# Patient Record
Sex: Female | Born: 1990 | Hispanic: No | Marital: Single | Smoking: Never smoker
Health system: Southern US, Community
[De-identification: ages and names within clinical notes are randomized; demographics above are authoritative.]

## PROBLEM LIST (undated history)

## (undated) DIAGNOSIS — J45909 Unspecified asthma, uncomplicated: Secondary | ICD-10-CM

## (undated) HISTORY — PX: NO PAST SURGERIES: SHX2092

---

## 2017-03-29 ENCOUNTER — Emergency Department (HOSPITAL_BASED_OUTPATIENT_CLINIC_OR_DEPARTMENT_OTHER)
Admission: EM | Admit: 2017-03-29 | Discharge: 2017-03-29 | Disposition: A | Discharge status: Home / Self Care | Payer: No Typology Code available for payment source | Attending: Emergency Medicine | Admitting: Emergency Medicine

## 2017-03-29 ENCOUNTER — Encounter (HOSPITAL_BASED_OUTPATIENT_CLINIC_OR_DEPARTMENT_OTHER): Payer: Self-pay | Admitting: *Deleted

## 2017-03-29 DIAGNOSIS — Z3A01 Less than 8 weeks gestation of pregnancy: Secondary | ICD-10-CM | POA: Diagnosis not present

## 2017-03-29 DIAGNOSIS — F17290 Nicotine dependence, other tobacco product, uncomplicated: Secondary | ICD-10-CM | POA: Insufficient documentation

## 2017-03-29 DIAGNOSIS — O26891 Other specified pregnancy related conditions, first trimester: Secondary | ICD-10-CM | POA: Diagnosis not present

## 2017-03-29 DIAGNOSIS — O219 Vomiting of pregnancy, unspecified: Secondary | ICD-10-CM | POA: Insufficient documentation

## 2017-03-29 DIAGNOSIS — R1013 Epigastric pain: Secondary | ICD-10-CM | POA: Insufficient documentation

## 2017-03-29 DIAGNOSIS — J45909 Unspecified asthma, uncomplicated: Secondary | ICD-10-CM | POA: Insufficient documentation

## 2017-03-29 DIAGNOSIS — Z3490 Encounter for supervision of normal pregnancy, unspecified, unspecified trimester: Secondary | ICD-10-CM

## 2017-03-29 DIAGNOSIS — R112 Nausea with vomiting, unspecified: Secondary | ICD-10-CM

## 2017-03-29 DIAGNOSIS — O99331 Smoking (tobacco) complicating pregnancy, first trimester: Secondary | ICD-10-CM | POA: Insufficient documentation

## 2017-03-29 HISTORY — DX: Unspecified asthma, uncomplicated: J45.909

## 2017-03-29 LAB — URINALYSIS, ROUTINE W REFLEX MICROSCOPIC
BILIRUBIN URINE: NEGATIVE
Glucose, UA: NEGATIVE mg/dL
Hgb urine dipstick: NEGATIVE
Ketones, ur: NEGATIVE mg/dL
NITRITE: NEGATIVE
PROTEIN: NEGATIVE mg/dL
Specific Gravity, Urine: 1.017 (ref 1.005–1.030)
pH: 6.5 (ref 5.0–8.0)

## 2017-03-29 LAB — URINALYSIS, MICROSCOPIC (REFLEX): RBC / HPF: NONE SEEN RBC/hpf (ref 0–5)

## 2017-03-29 LAB — PREGNANCY, URINE: PREG TEST UR: POSITIVE — AB

## 2017-03-29 MED ORDER — PROMETHAZINE HCL 25 MG PO TABS: 25.0000 mg | ORAL_TABLET | Freq: Four times a day (QID) | ORAL | 1 refills | Status: DC | PRN

## 2017-03-29 MED ORDER — ONDANSETRON 4 MG PO TBDP
4.0000 mg | ORAL_TABLET | Freq: Once | ORAL | Status: AC
  Administered 2017-03-29: 4 mg via ORAL
  Filled 2017-03-29: qty 1

## 2017-03-29 MED ORDER — PRENATAL VITAMINS 28-0.8 MG PO TABS: 1.0000 | ORAL_TABLET | Freq: Every day | ORAL | 3 refills | Status: DC

## 2017-03-29 NOTE — ED Triage Notes (Signed)
Pt reports abd pain, n/v x1wk. Denies fever, diarrhea, genitourinary symptoms.

## 2017-03-29 NOTE — ED Notes (Signed)
Pt ambulating, in NAD. Given vomit bag.

## 2017-03-29 NOTE — ED Notes (Signed)
MD notified of pt's positive pregnancy test. Okay per MD to hold off on drawing bloodwork until he's assessed pt.

## 2017-03-29 NOTE — Discharge Instructions (Signed)
Pregnancy test positive. Start taking prenatal vitamins. Follow-up with OB/GYN in the [place] area. Take the Phenergan as needed for nausea and vomiting. Return for severe lower abdominal pain or vaginal bleeding.

## 2017-03-29 NOTE — ED Provider Notes (Signed)
[facility] Provider Note   CSN: [id number] Arrival date & time: 03/29/17  0715     History   Chief Complaint Chief Complaint  Patient presents with  . Abdominal Pain    HPI Monica Glover is a 26 y.o. female.  Patient with one-week history of nausea and vomiting 2-3 times a day. Able to keep down crackers and fruit. No diarrhea no fevers. Some epigastric abdominal discomfort. No lower abdominal discomfort. No vaginal bleeding. No dysuria. Last menstrual period unknown periods are regular.      Past Medical History:  Diagnosis Date  . Asthma     There are no active problems to display for this patient.   History reviewed. No pertinent surgical history.  OB History    No data available       Home Medications    Prior to Admission medications   Medication Sig Start Date End Date Taking? Authorizing Provider  Prenatal Vit-Fe Fumarate-FA (PRENATAL VITAMINS) 28-0.8 MG TABS Take 1 tablet by mouth daily. 03/29/17   Vanetta Mulders, MD  promethazine (PHENERGAN) 25 MG tablet Take 1 tablet (25 mg total) by mouth every 6 (six) hours as needed for nausea or vomiting. 03/29/17   Vanetta Mulders, MD    Family History No family history on file.  Social History Social History  Substance Use Topics  . Smoking status: Current Some Day Smoker    Types: Cigars  . Smokeless tobacco: Never Used  . Alcohol use No     Allergies   Patient has no known allergies.   Review of Systems Review of Systems  Constitutional: Negative for fever.  HENT: Negative for congestion.   Respiratory: Negative for shortness of breath.   Cardiovascular: Negative for chest pain.  Gastrointestinal: Positive for abdominal pain, nausea and vomiting. Negative for diarrhea.  Genitourinary: Negative for dysuria and vaginal bleeding.  Musculoskeletal: Negative for back pain.  Skin: Negative for rash.  Neurological: Negative for headaches.  Hematological: Does not bruise/bleed  easily.  Psychiatric/Behavioral: Negative for confusion.     Physical Exam Updated Vital Signs BP (!) 123/58 (BP Location: Left Arm)   Pulse 76   Temp 97.8 F (36.6 C) (Oral)   Resp 18   Ht  (1.676 m)   Wt 97.1 kg   LMP 03/10/2017 (Approximate)   SpO2 100%   BMI 34.54 kg/m   Physical Exam  Constitutional: She is oriented to person, place, and time. She appears well-developed and well-nourished. No distress.  HENT:  Head: Normocephalic and atraumatic.  Mouth/Throat: Oropharynx is clear and moist.  Eyes: Conjunctivae and EOM are normal. Pupils are equal, round, and reactive to light.  Neck: Normal range of motion. Neck supple.  Cardiovascular: Normal rate, regular rhythm and normal heart sounds.   Pulmonary/Chest: Effort normal and breath sounds normal. No respiratory distress.  Abdominal: Soft. Bowel sounds are normal. There is no tenderness.  Musculoskeletal: Normal range of motion.  Neurological: She is alert and oriented to person, place, and time. No cranial nerve deficit or sensory deficit. She exhibits normal muscle tone. Coordination normal.  Skin: Skin is warm.  Nursing note and vitals reviewed.    ED Treatments / Results  Labs (all labs ordered are listed, but only abnormal results are displayed) Labs Reviewed  URINALYSIS, ROUTINE W REFLEX MICROSCOPIC - Abnormal; Notable for the following:       Result Value   APPearance TURBID (*)    Leukocytes, UA SMALL (*)    All other components  within normal limits  PREGNANCY, URINE - Abnormal; Notable for the following:    Preg Test, Ur POSITIVE (*)    All other components within normal limits  URINALYSIS, MICROSCOPIC (REFLEX) - Abnormal; Notable for the following:    Bacteria, UA MANY (*)    Squamous Epithelial / LPF 6-30 (*)    All other components within normal limits    EKG  EKG Interpretation None       Radiology No results found.  Procedures Procedures (including critical care  time)  Medications Ordered in ED Medications  ondansetron (ZOFRAN-ODT) disintegrating tablet 4 mg (not administered)     Initial Impression / Assessment and Plan / ED Course  I have reviewed the triage vital signs and the nursing notes.  Pertinent labs & imaging results that were available during my care of the patient were reviewed by me and considered in my medical decision making (see chart for details).    Patient gravida 1 para 0. Last measured. Unknown since periods have been irregular. Positive pregnancy test today probably explains the nausea and vomiting. Patient nontoxic no acute distress. Will treat with Phenergan and prenatal vitamins patients from the [facility] area social follow-up with OB/GYN there. No vaginal bleeding no lower abdominal pain. No concerns for ectopic pregnancy at this time.   Final Clinical Impressions(s) / ED Diagnoses   Final diagnoses:  Pregnancy, unspecified gestational age  Non-intractable vomiting with nausea, unspecified vomiting type    New Prescriptions New Prescriptions   PRENATAL VIT-FE FUMARATE-FA (PRENATAL VITAMINS) 28-0.8 MG TABS    Take 1 tablet by mouth daily.   PROMETHAZINE (PHENERGAN) 25 MG TABLET    Take 1 tablet (25 mg total) by mouth every 6 (six) hours as needed for nausea or vomiting.     Vanetta Mulders, MD 03/29/17 [id number]

## 2017-11-24 ENCOUNTER — Other Ambulatory Visit: Payer: Self-pay

## 2017-11-24 ENCOUNTER — Inpatient Hospital Stay (HOSPITAL_COMMUNITY)
Admission: AD | Admit: 2017-11-24 | Discharge: 2017-11-26 | DRG: 787 | Disposition: A | Discharge status: Home / Self Care | Payer: Medicaid Other | Source: Ambulatory Visit | Attending: Obstetrics and Gynecology | Admitting: Obstetrics and Gynecology

## 2017-11-24 ENCOUNTER — Inpatient Hospital Stay (HOSPITAL_COMMUNITY): Payer: Medicaid Other | Admitting: Anesthesiology

## 2017-11-24 ENCOUNTER — Encounter (HOSPITAL_COMMUNITY): Payer: Self-pay | Admitting: *Deleted

## 2017-11-24 ENCOUNTER — Encounter (HOSPITAL_COMMUNITY)
Admission: AD | Disposition: A | Discharge status: Home / Self Care | Payer: Self-pay | Source: Ambulatory Visit | Attending: Obstetrics and Gynecology

## 2017-11-24 DIAGNOSIS — E669 Obesity, unspecified: Secondary | ICD-10-CM | POA: Diagnosis present

## 2017-11-24 DIAGNOSIS — Z3A40 40 weeks gestation of pregnancy: Secondary | ICD-10-CM

## 2017-11-24 DIAGNOSIS — O9081 Anemia of the puerperium: Secondary | ICD-10-CM | POA: Diagnosis not present

## 2017-11-24 DIAGNOSIS — D62 Acute posthemorrhagic anemia: Secondary | ICD-10-CM | POA: Diagnosis not present

## 2017-11-24 DIAGNOSIS — J45909 Unspecified asthma, uncomplicated: Secondary | ICD-10-CM | POA: Diagnosis present

## 2017-11-24 DIAGNOSIS — O99214 Obesity complicating childbirth: Principal | ICD-10-CM | POA: Diagnosis present

## 2017-11-24 DIAGNOSIS — O9952 Diseases of the respiratory system complicating childbirth: Secondary | ICD-10-CM | POA: Diagnosis present

## 2017-11-24 DIAGNOSIS — O43123 Velamentous insertion of umbilical cord, third trimester: Secondary | ICD-10-CM | POA: Diagnosis present

## 2017-11-24 DIAGNOSIS — Z3483 Encounter for supervision of other normal pregnancy, third trimester: Secondary | ICD-10-CM | POA: Diagnosis present

## 2017-11-24 LAB — CBC
HCT: 35.6 % — ABNORMAL LOW (ref 36.0–46.0)
HEMOGLOBIN: 11.3 g/dL — AB (ref 12.0–15.0)
MCH: 28.8 pg (ref 26.0–34.0)
MCHC: 31.7 g/dL (ref 30.0–36.0)
MCV: 90.6 fL (ref 78.0–100.0)
Platelets: 218 10*3/uL (ref 150–400)
RBC: 3.93 MIL/uL (ref 3.87–5.11)
RDW: 15.2 % (ref 11.5–15.5)
WBC: 10.4 10*3/uL (ref 4.0–10.5)

## 2017-11-24 LAB — TYPE AND SCREEN
ABO/RH(D): O POS
ANTIBODY SCREEN: NEGATIVE

## 2017-11-24 LAB — RPR: RPR: NONREACTIVE

## 2017-11-24 LAB — ABO/RH: ABO/RH(D): O POS

## 2017-11-24 SURGERY — Surgical Case
Anesthesia: Epidural

## 2017-11-24 MED ORDER — TETANUS-DIPHTH-ACELL PERTUSSIS 5-2.5-18.5 LF-MCG/0.5 IM SUSP: 0.5000 mL | Freq: Once | INTRAMUSCULAR | Status: DC

## 2017-11-24 MED ORDER — SODIUM CHLORIDE 0.9% FLUSH: 3.0000 mL | INTRAVENOUS | Status: DC | PRN

## 2017-11-24 MED ORDER — OXYCODONE-ACETAMINOPHEN 5-325 MG PO TABS: 1.0000 | ORAL_TABLET | ORAL | Status: DC | PRN

## 2017-11-24 MED ORDER — OXYTOCIN 40 UNITS IN LACTATED RINGERS INFUSION - SIMPLE MED
2.5000 [IU]/h | INTRAVENOUS | Status: DC
  Filled 2017-11-24: qty 1000

## 2017-11-24 MED ORDER — OXYCODONE HCL 5 MG PO TABS: 5.0000 mg | ORAL_TABLET | Freq: Once | ORAL | Status: DC | PRN

## 2017-11-24 MED ORDER — LIDOCAINE-EPINEPHRINE 2 %-1:100000 IJ SOLN
INTRAMUSCULAR | Status: DC | PRN
  Administered 2017-11-24: 3 mL via INTRADERMAL
  Administered 2017-11-24: 7 mL via INTRADERMAL
  Administered 2017-11-24: 5 mL via INTRADERMAL

## 2017-11-24 MED ORDER — SENNOSIDES-DOCUSATE SODIUM 8.6-50 MG PO TABS
2.0000 | ORAL_TABLET | ORAL | Status: DC
  Administered 2017-11-25 (×2): 2 via ORAL
  Filled 2017-11-24 (×2): qty 2

## 2017-11-24 MED ORDER — SODIUM BICARBONATE 8.4 % IV SOLN
INTRAVENOUS | Status: AC
  Filled 2017-11-24: qty 50

## 2017-11-24 MED ORDER — OXYCODONE-ACETAMINOPHEN 5-325 MG PO TABS
2.0000 | ORAL_TABLET | ORAL | Status: DC | PRN
  Administered 2017-11-26: 2 via ORAL
  Filled 2017-11-24: qty 2

## 2017-11-24 MED ORDER — OXYTOCIN 40 UNITS IN LACTATED RINGERS INFUSION - SIMPLE MED
1.0000 m[IU]/min | INTRAVENOUS | Status: DC
  Administered 2017-11-24: 2 m[IU]/min via INTRAVENOUS

## 2017-11-24 MED ORDER — MORPHINE SULFATE (PF) 0.5 MG/ML IJ SOLN
INTRAMUSCULAR | Status: AC
  Filled 2017-11-24: qty 10

## 2017-11-24 MED ORDER — METHYLERGONOVINE MALEATE 0.2 MG/ML IJ SOLN: 0.2000 mg | INTRAMUSCULAR | Status: DC | PRN

## 2017-11-24 MED ORDER — DIPHENHYDRAMINE HCL 25 MG PO CAPS: 25.0000 mg | ORAL_CAPSULE | ORAL | Status: DC | PRN

## 2017-11-24 MED ORDER — MORPHINE SULFATE (PF) 0.5 MG/ML IJ SOLN
INTRAMUSCULAR | Status: DC | PRN
  Administered 2017-11-24: 4 mg via EPIDURAL
  Administered 2017-11-24: 1 mg via INTRAVENOUS

## 2017-11-24 MED ORDER — COCONUT OIL OIL: 1.0000 "application " | TOPICAL_OIL | Status: DC | PRN

## 2017-11-24 MED ORDER — ONDANSETRON HCL 4 MG/2ML IJ SOLN: 4.0000 mg | Freq: Four times a day (QID) | INTRAMUSCULAR | Status: DC | PRN

## 2017-11-24 MED ORDER — LACTATED RINGERS IV SOLN: 500.0000 mL | Freq: Once | INTRAVENOUS | Status: DC

## 2017-11-24 MED ORDER — SIMETHICONE 80 MG PO CHEW
80.0000 mg | CHEWABLE_TABLET | Freq: Three times a day (TID) | ORAL | Status: DC
  Administered 2017-11-25 – 2017-11-26 (×4): 80 mg via ORAL
  Filled 2017-11-24 (×5): qty 1

## 2017-11-24 MED ORDER — ACETAMINOPHEN 325 MG PO TABS: 650.0000 mg | ORAL_TABLET | ORAL | Status: DC | PRN

## 2017-11-24 MED ORDER — NALBUPHINE HCL 10 MG/ML IJ SOLN: 5.0000 mg | Freq: Once | INTRAMUSCULAR | Status: DC | PRN

## 2017-11-24 MED ORDER — SCOPOLAMINE 1 MG/3DAYS TD PT72
1.0000 | MEDICATED_PATCH | Freq: Once | TRANSDERMAL | Status: DC
  Filled 2017-11-24: qty 1

## 2017-11-24 MED ORDER — KETOROLAC TROMETHAMINE 30 MG/ML IJ SOLN
INTRAMUSCULAR | Status: AC
  Administered 2017-11-25: 30 mg via INTRAVENOUS
  Filled 2017-11-24: qty 1

## 2017-11-24 MED ORDER — DIPHENHYDRAMINE HCL 50 MG/ML IJ SOLN: 12.5000 mg | INTRAMUSCULAR | Status: DC | PRN

## 2017-11-24 MED ORDER — CEFAZOLIN SODIUM-DEXTROSE 2-3 GM-%(50ML) IV SOLR
INTRAVENOUS | Status: DC | PRN
  Administered 2017-11-24: 2 g via INTRAVENOUS

## 2017-11-24 MED ORDER — PRENATAL MULTIVITAMIN CH
1.0000 | ORAL_TABLET | Freq: Every day | ORAL | Status: DC
  Administered 2017-11-25 – 2017-11-26 (×2): 1 via ORAL
  Filled 2017-11-24 (×2): qty 1

## 2017-11-24 MED ORDER — CEFAZOLIN SODIUM-DEXTROSE 2-3 GM-%(50ML) IV SOLR
INTRAVENOUS | Status: AC
  Filled 2017-11-24: qty 50

## 2017-11-24 MED ORDER — KETOROLAC TROMETHAMINE 30 MG/ML IJ SOLN
30.0000 mg | Freq: Four times a day (QID) | INTRAMUSCULAR | Status: AC | PRN
  Administered 2017-11-25: 30 mg via INTRAVENOUS
  Filled 2017-11-24: qty 1

## 2017-11-24 MED ORDER — HYDROMORPHONE HCL 1 MG/ML IJ SOLN: 0.2500 mg | INTRAMUSCULAR | Status: DC | PRN

## 2017-11-24 MED ORDER — METHYLERGONOVINE MALEATE 0.2 MG PO TABS: 0.2000 mg | ORAL_TABLET | ORAL | Status: DC | PRN

## 2017-11-24 MED ORDER — EPHEDRINE 5 MG/ML INJ: 10.0000 mg | INTRAVENOUS | Status: DC | PRN

## 2017-11-24 MED ORDER — NALBUPHINE HCL 10 MG/ML IJ SOLN: 5.0000 mg | INTRAMUSCULAR | Status: DC | PRN

## 2017-11-24 MED ORDER — PHENYLEPHRINE HCL 10 MG/ML IJ SOLN
INTRAMUSCULAR | Status: DC | PRN
  Administered 2017-11-24 (×3): 80 ug via INTRAVENOUS

## 2017-11-24 MED ORDER — ALBUTEROL SULFATE (2.5 MG/3ML) 0.083% IN NEBU: 3.0000 mL | INHALATION_SOLUTION | Freq: Four times a day (QID) | RESPIRATORY_TRACT | Status: DC | PRN

## 2017-11-24 MED ORDER — DIBUCAINE 1 % RE OINT: 1.0000 "application " | TOPICAL_OINTMENT | RECTAL | Status: DC | PRN

## 2017-11-24 MED ORDER — FENTANYL 2.5 MCG/ML BUPIVACAINE 1/10 % EPIDURAL INFUSION (WH - ANES)
INTRAMUSCULAR | Status: AC
  Filled 2017-11-24: qty 100

## 2017-11-24 MED ORDER — OXYCODONE-ACETAMINOPHEN 5-325 MG PO TABS: 2.0000 | ORAL_TABLET | ORAL | Status: DC | PRN

## 2017-11-24 MED ORDER — PHENYLEPHRINE 40 MCG/ML (10ML) SYRINGE FOR IV PUSH (FOR BLOOD PRESSURE SUPPORT): 80.0000 ug | PREFILLED_SYRINGE | INTRAVENOUS | Status: DC | PRN

## 2017-11-24 MED ORDER — OXYTOCIN 10 UNIT/ML IJ SOLN: 10.0000 [IU] | Freq: Once | INTRAMUSCULAR | Status: DC

## 2017-11-24 MED ORDER — ONDANSETRON HCL 4 MG/2ML IJ SOLN: 4.0000 mg | Freq: Three times a day (TID) | INTRAMUSCULAR | Status: DC | PRN

## 2017-11-24 MED ORDER — OXYTOCIN BOLUS FROM INFUSION
500.0000 mL | Freq: Once | INTRAVENOUS | Status: DC
Start: 2017-11-24 — End: 2017-11-24

## 2017-11-24 MED ORDER — LACTATED RINGERS IV SOLN
INTRAVENOUS | Status: DC
  Administered 2017-11-25: 02:00:00 via INTRAVENOUS

## 2017-11-24 MED ORDER — NALOXONE HCL 0.4 MG/ML IJ SOLN
1.0000 ug/kg/h | INTRAMUSCULAR | Status: DC | PRN
  Filled 2017-11-24: qty 5

## 2017-11-24 MED ORDER — MENTHOL 3 MG MT LOZG: 1.0000 | LOZENGE | OROMUCOSAL | Status: DC | PRN

## 2017-11-24 MED ORDER — OXYTOCIN 40 UNITS IN LACTATED RINGERS INFUSION - SIMPLE MED: 2.5000 [IU]/h | INTRAVENOUS | Status: DC

## 2017-11-24 MED ORDER — LIDOCAINE HCL (PF) 1 % IJ SOLN
INTRAMUSCULAR | Status: DC | PRN
  Administered 2017-11-24 (×2): 5 mL via EPIDURAL

## 2017-11-24 MED ORDER — SCOPOLAMINE 1 MG/3DAYS TD PT72
MEDICATED_PATCH | TRANSDERMAL | Status: DC | PRN
  Administered 2017-11-24: 1 via TRANSDERMAL

## 2017-11-24 MED ORDER — WITCH HAZEL-GLYCERIN EX PADS: 1.0000 "application " | MEDICATED_PAD | CUTANEOUS | Status: DC | PRN

## 2017-11-24 MED ORDER — PHENYLEPHRINE 40 MCG/ML (10ML) SYRINGE FOR IV PUSH (FOR BLOOD PRESSURE SUPPORT)
PREFILLED_SYRINGE | INTRAVENOUS | Status: AC
  Filled 2017-11-24: qty 10

## 2017-11-24 MED ORDER — SOD CITRATE-CITRIC ACID 500-334 MG/5ML PO SOLN
30.0000 mL | ORAL | Status: DC | PRN
  Administered 2017-11-24: 30 mL via ORAL
  Filled 2017-11-24: qty 15

## 2017-11-24 MED ORDER — LACTATED RINGERS IV SOLN
500.0000 mL | Freq: Once | INTRAVENOUS | Status: AC
  Administered 2017-11-24: 750 mL via INTRAVENOUS

## 2017-11-24 MED ORDER — LACTATED RINGERS IV SOLN: 500.0000 mL | INTRAVENOUS | Status: DC | PRN

## 2017-11-24 MED ORDER — ONDANSETRON HCL 4 MG/2ML IJ SOLN
INTRAMUSCULAR | Status: DC | PRN
  Administered 2017-11-24: 4 mg via INTRAVENOUS

## 2017-11-24 MED ORDER — OXYTOCIN 10 UNIT/ML IJ SOLN
INTRAMUSCULAR | Status: DC | PRN
  Administered 2017-11-24: 40 [IU] via INTRAVENOUS

## 2017-11-24 MED ORDER — ZOLPIDEM TARTRATE 5 MG PO TABS
5.0000 mg | ORAL_TABLET | Freq: Every evening | ORAL | Status: DC | PRN
Start: 2017-11-24 — End: 2017-11-25

## 2017-11-24 MED ORDER — LACTATED RINGERS IV SOLN
INTRAVENOUS | Status: DC | PRN
  Administered 2017-11-24: 16:00:00 via INTRAVENOUS

## 2017-11-24 MED ORDER — BUPIVACAINE HCL (PF) 0.25 % IJ SOLN
INTRAMUSCULAR | Status: AC
  Filled 2017-11-24: qty 20

## 2017-11-24 MED ORDER — TERBUTALINE SULFATE 1 MG/ML IJ SOLN: 0.2500 mg | Freq: Once | INTRAMUSCULAR | Status: DC | PRN

## 2017-11-24 MED ORDER — SIMETHICONE 80 MG PO CHEW: 80.0000 mg | CHEWABLE_TABLET | ORAL | Status: DC | PRN

## 2017-11-24 MED ORDER — LACTATED RINGERS IV SOLN: INTRAVENOUS | Status: DC

## 2017-11-24 MED ORDER — SCOPOLAMINE 1 MG/3DAYS TD PT72
MEDICATED_PATCH | TRANSDERMAL | Status: AC
  Filled 2017-11-24: qty 1

## 2017-11-24 MED ORDER — BUPIVACAINE HCL (PF) 0.25 % IJ SOLN
INTRAMUSCULAR | Status: DC | PRN
  Administered 2017-11-24: 20 mL

## 2017-11-24 MED ORDER — LACTATED RINGERS IV SOLN
INTRAVENOUS | Status: DC | PRN
  Administered 2017-11-24: 15:00:00 via INTRAVENOUS

## 2017-11-24 MED ORDER — LACTATED RINGERS IV BOLUS (SEPSIS): 300.0000 mL | Freq: Once | INTRAVENOUS | Status: DC

## 2017-11-24 MED ORDER — SIMETHICONE 80 MG PO CHEW
80.0000 mg | CHEWABLE_TABLET | ORAL | Status: DC
  Administered 2017-11-25 (×2): 80 mg via ORAL
  Filled 2017-11-24 (×2): qty 1

## 2017-11-24 MED ORDER — OXYCODONE HCL 5 MG/5ML PO SOLN
5.0000 mg | Freq: Once | ORAL | Status: DC | PRN
Start: 2017-11-24 — End: 2017-11-24

## 2017-11-24 MED ORDER — DIPHENHYDRAMINE HCL 25 MG PO CAPS: 25.0000 mg | ORAL_CAPSULE | Freq: Four times a day (QID) | ORAL | Status: DC | PRN

## 2017-11-24 MED ORDER — LIDOCAINE HCL (PF) 1 % IJ SOLN
30.0000 mL | INTRAMUSCULAR | Status: DC | PRN
  Filled 2017-11-24: qty 30

## 2017-11-24 MED ORDER — LIDOCAINE-EPINEPHRINE (PF) 2 %-1:200000 IJ SOLN
INTRAMUSCULAR | Status: AC
  Filled 2017-11-24: qty 20

## 2017-11-24 MED ORDER — OXYTOCIN 10 UNIT/ML IJ SOLN
10.0000 [IU] | Freq: Once | INTRAMUSCULAR | Status: DC
  Filled 2017-11-24: qty 1

## 2017-11-24 MED ORDER — KETOROLAC TROMETHAMINE 30 MG/ML IJ SOLN
30.0000 mg | Freq: Four times a day (QID) | INTRAMUSCULAR | Status: DC | PRN
  Administered 2017-11-24: 30 mg via INTRAMUSCULAR

## 2017-11-24 MED ORDER — NALOXONE HCL 0.4 MG/ML IJ SOLN: 0.4000 mg | INTRAMUSCULAR | Status: DC | PRN

## 2017-11-24 MED ORDER — IBUPROFEN 600 MG PO TABS
600.0000 mg | ORAL_TABLET | Freq: Four times a day (QID) | ORAL | Status: DC
  Filled 2017-11-24: qty 1

## 2017-11-24 MED ORDER — FENTANYL 2.5 MCG/ML BUPIVACAINE 1/10 % EPIDURAL INFUSION (WH - ANES)
14.0000 mL/h | INTRAMUSCULAR | Status: DC | PRN
  Administered 2017-11-24: 14 mL/h via EPIDURAL

## 2017-11-24 SURGICAL SUPPLY — 35 items
CHLORAPREP W/TINT 26ML (MISCELLANEOUS) ×3 IMPLANT
CLAMP CORD UMBIL (MISCELLANEOUS) IMPLANT
CLOTH BEACON ORANGE TIMEOUT ST (SAFETY) ×3 IMPLANT
CONTAINER PREFILL 10% NBF 15ML (MISCELLANEOUS) IMPLANT
DERMABOND ADHESIVE PROPEN (GAUZE/BANDAGES/DRESSINGS) ×2
DERMABOND ADVANCED .7 DNX6 (GAUZE/BANDAGES/DRESSINGS) ×1 IMPLANT
DRSG OPSITE POSTOP 4X10 (GAUZE/BANDAGES/DRESSINGS) ×3 IMPLANT
ELECT REM PT RETURN 9FT ADLT (ELECTROSURGICAL) ×3
ELECTRODE REM PT RTRN 9FT ADLT (ELECTROSURGICAL) ×1 IMPLANT
EXTRACTOR VACUUM M CUP 4 TUBE (SUCTIONS) IMPLANT
EXTRACTOR VACUUM M CUP 4' TUBE (SUCTIONS)
GLOVE BIO SURGEON STRL SZ7.5 (GLOVE) ×3 IMPLANT
GLOVE BIOGEL PI IND STRL 7.0 (GLOVE) ×1 IMPLANT
GLOVE BIOGEL PI INDICATOR 7.0 (GLOVE) ×2
GOWN STRL REUS W/TWL LRG LVL3 (GOWN DISPOSABLE) ×6 IMPLANT
KIT ABG SYR 3ML LUER SLIP (SYRINGE) IMPLANT
NEEDLE HYPO 22GX1.5 SAFETY (NEEDLE) ×3 IMPLANT
NEEDLE HYPO 25X5/8 SAFETYGLIDE (NEEDLE) IMPLANT
NEEDLE SPNL 20GX3.5 QUINCKE YW (NEEDLE) IMPLANT
NS IRRIG 1000ML POUR BTL (IV SOLUTION) ×3 IMPLANT
PACK C SECTION WH (CUSTOM PROCEDURE TRAY) ×3 IMPLANT
PENCIL SMOKE EVAC W/HOLSTER (ELECTROSURGICAL) ×3 IMPLANT
SUT MNCRL 0 VIOLET CTX 36 (SUTURE) ×2 IMPLANT
SUT MNCRL AB 3-0 PS2 27 (SUTURE) IMPLANT
SUT MON AB 2-0 CT1 27 (SUTURE) ×3 IMPLANT
SUT MON AB-0 CT1 36 (SUTURE) ×6 IMPLANT
SUT MONOCRYL 0 CTX 36 (SUTURE) ×4
SUT PLAIN 0 NONE (SUTURE) IMPLANT
SUT PLAIN 2 0 (SUTURE)
SUT PLAIN 2 0 XLH (SUTURE) IMPLANT
SUT PLAIN ABS 2-0 CT1 27XMFL (SUTURE) IMPLANT
SYR 20CC LL (SYRINGE) IMPLANT
SYR CONTROL 10ML LL (SYRINGE) ×3 IMPLANT
TOWEL OR 17X24 6PK STRL BLUE (TOWEL DISPOSABLE) ×3 IMPLANT
TRAY FOLEY BAG SILVER LF 14FR (SET/KITS/TRAYS/PACK) ×3 IMPLANT

## 2017-11-24 NOTE — Transfer of Care (Signed)
Immediate Anesthesia Transfer of Care Note  Patient: Monica Glover  Procedure(s) Performed: CESAREAN SECTION (N/A )  Patient Location: PACU  Anesthesia Type:Epidural  Level of Consciousness: awake, alert  and oriented  Airway & Oxygen Therapy: Patient Spontanous Breathing  Post-op Assessment: Report given to RN and Post -op Vital signs reviewed and stable  Post vital signs: Reviewed and stable  Last Vitals:  Vitals:   11/24/17 1431 11/24/17 1501  BP: (!) 148/127 124/72  Pulse: (!) 261 (!) 109  Resp: 18 18  Temp:    SpO2:      Last Pain:  Vitals:   11/24/17 0837  TempSrc: Oral  PainSc:       Patients Stated Pain Goal: 5 (11/24/17 0531)  Complications: No apparent anesthesia complications

## 2017-11-24 NOTE — Progress Notes (Signed)
S:  Some pressure with ctx - no pain  O:  VS: Blood pressure 118/84, pulse (!) 212, temperature 99.1 F (37.3 C), temperature source Oral, resp. rate 18, height 5\' 5"  (1.651 m), weight 112.9 kg (249 lb), SpO2 100 %.        FHR : baseline 135 / variability moderate  / varaible decelerations        Toco: contractions every 2-4 minutes / coupling pattern / pitocin 6 mu/min        Cervix : same with fetal rotation to direct OP with increased caput and posterior cervix edematous                        vtx not well-applied to cervix but not asynclitic on exam        Membranes: clear with show  A: protracted labor - suspect arrest of descent     FHR category 2  P: reposition far lateral to attempt to facilitate further fetal rotation into OA for descent - peanut ball      recheck 1-2 hours           if no progression or worsening cervical edema - will proceed with C-section           discussed with patient and spouse - understands plan     Marlinda MikeBAILEY, TANYA CNM, MSN, [facility] 11/24/2017, 1:37 PM

## 2017-11-24 NOTE — Progress Notes (Signed)
Patient ID: Monica MichaelsDahquaisha Dorman, female   DOB: 12/03/1991, 26 y.o.   MRN: [id number] Patient seen and examined. Consent witnessed and signed. No changes noted. Update completed. BP (!) 143/107   Pulse (!) 131   Temp 99.1 F (37.3 C) (Oral)   Resp 18   Ht 5\' 5"  (1.651 m)   Wt 112.9 kg (249 lb)   SpO2 100%   BMI 41.44 kg/m   CBC    Component Value Date/Time   WBC 10.4 11/24/2017 0730   RBC 3.93 11/24/2017 0730   HGB 11.3 (L) 11/24/2017 0730   HCT 35.6 (L) 11/24/2017 0730   PLT 218 11/24/2017 0730   MCV 90.6 11/24/2017 0730   MCH 28.8 11/24/2017 0730   MCHC 31.7 11/24/2017 0730   RDW 15.2 11/24/2017 0730   Proceed with csection for active phase arrest.

## 2017-11-24 NOTE — H&P (Signed)
OB ADMISSION/ HISTORY & PHYSICAL:  Admission Date: 11/24/2017  4:27 AM  Admit Diagnosis: term pregnancy in labor   Monica Glover is a 26 y.o. female presenting for painful ctx since midnight and regular. No LOF, + small show, + FM. Denies HA/NV/RUQ pain Prodromal labor last 2 days. Was planning waterbirth, unable to get supplies d/t inclement weather.  Prenatal History: G1P0   EDC : 11/18/2017, by Other [facility]  [facility]atal care at [facility] since 37 wks, transfer of care from [facility], good prenatal care since [redacted] weeks gestation.  Prenatal course complicated by hx asthma (stable), obesity  Prenatal Labs: ABO, Rh:   O pos Antibody:  neg Rubella:   immune RPR:   NR HBsAg:   neg HIV:   neg GBS:   neg 1 hr Glucola : wnl HGB electrophoresis wnl Genetics declined Anatomy scan wnl, anterior placenta, marginal cord insertion.   Medical / Surgical History :  Past medical history:  Past Medical History:  Diagnosis Date  . Asthma      Past surgical history:  Past Surgical History:  Procedure Laterality Date  . NO PAST SURGERIES       Family History: No family history on file.   Social History:  reports that  has never smoked. she has never used smokeless tobacco. She reports that she does not drink alcohol or use drugs.   Allergies: Patient has no known allergies.    Current Medications at time of admission:  Medications Prior to Admission  Medication Sig Dispense Refill Last Dose  . Prenatal Vit-Fe Fumarate-FA (PRENATAL VITAMINS) 28-0.8 MG TABS Take 1 tablet by mouth daily. 30 tablet 3 11/23/2017 at Unknown time  . albuterol (PROVENTIL HFA;VENTOLIN HFA) 108 (90 Base) MCG/ACT inhaler Inhale into the lungs every 6 (six) hours as needed for wheezing or shortness of breath.   More than a month at Unknown time  . promethazine (PHENERGAN) 25 MG tablet Take 1 tablet (25 mg total) by mouth every 6 (six) hours as needed for nausea or vomiting. 20 tablet 1        Review of Systems: ROS As noted in HPI  Physical Exam:  Dilation: 7 Effacement (%): 100 Station: -3 Vitals:   11/24/17 0434 11/24/17 0458  BP: 134/67   Pulse: 78   Resp: 18   Temp: 98.6 F (37 C)   TempSrc: Oral   Weight:  112.9 kg (249 lb)  Height:  5\' 5"  (1.651 m)    General: AAO x 3, NAD, coping well w/ ctx Heart: RRR Lungs:CTAB Abdomen:gravid, NT Extremities:trace pedal edema Genitalia / VE: no lesions, IBOW FHR: 140, mod var, pos accels, no decels TOCO: q 2-3 min  Labs:    CBC, RPR, T&S pending   Assessment:  26 y.o. G1P0 at 8262w6d  1. Active stage of labor 2. FHR category 1 3. GBS neg 4. Desires natural labor 5. Breastfeeding  Plan:  1. Admit to BS 2. Routine L&D orders 3. Analgesia/anesthesia PRN  4. Expectant management 5. Anticipate NSVB   Dr Billy Coastaavon notified of admission / plan of care   Neta MendsDaniela C Chayson Charters CNM, MSN 11/24/2017, 4:59 AM

## 2017-11-24 NOTE — Anesthesia Procedure Notes (Signed)
Epidural Patient location during procedure: OB Start time: 11/24/2017 11:21 AM End time: 11/24/2017 11:29 AM  Staffing Anesthesiologist: Achille RichHodierne, Amitai Delaughter, MD Performed: anesthesiologist   Preanesthetic Checklist Completed: patient identified, site marked, pre-op evaluation, timeout performed, IV checked, risks and benefits discussed and monitors and equipment checked  Epidural Patient position: sitting Prep: DuraPrep Patient monitoring: heart rate, cardiac monitor, continuous pulse ox and blood pressure Approach: midline Location: L2-L3 Injection technique: LOR saline  Needle:  Needle type: Tuohy  Needle gauge: 17 G Needle length: 9 cm Needle insertion depth: 8 cm Catheter type: closed end flexible Catheter size: 19 Gauge Catheter at skin depth: 14 cm Test dose: negative and Other  Assessment Events: blood not aspirated, injection not painful, no injection resistance and negative IV test  Additional Notes Informed consent obtained prior to proceeding including risk of failure, 1% risk of PDPH, risk of minor discomfort and bruising.  Discussed rare but serious complications including epidural abscess, permanent nerve injury, epidural hematoma.  Discussed alternatives to epidural analgesia and patient desires to proceed.  Timeout performed pre-procedure verifying patient name, procedure, and platelet count.  Patient tolerated procedure well. Reason for block:procedure for pain

## 2017-11-24 NOTE — Anesthesia Preprocedure Evaluation (Signed)
Anesthesia Evaluation  Patient identified by MRN, date of birth, ID band Patient awake    Reviewed: Allergy & Precautions, H&P , NPO status , Patient's Chart, lab work & pertinent test results  Airway Mallampati: II   Neck ROM: full    Dental   Pulmonary asthma ,    breath sounds clear to auscultation       Cardiovascular negative cardio ROS   Rhythm:regular Rate:Normal     Neuro/Psych    GI/Hepatic   Endo/Other    Renal/GU      Musculoskeletal   Abdominal   Peds  Hematology   Anesthesia Other Findings   Reproductive/Obstetrics (+) Pregnancy                             Anesthesia Physical Anesthesia Plan  ASA: II  Anesthesia Plan: Epidural   Post-op Pain Management:    Induction: Intravenous  PONV Risk Score and Plan: 2 and Treatment may vary due to age or medical condition  Airway Management Planned: Natural Airway  Additional Equipment:   Intra-op Plan:   Post-operative Plan:   Informed Consent: I have reviewed the patients History and Physical, chart, labs and discussed the procedure including the risks, benefits and alternatives for the proposed anesthesia with the patient or authorized representative who has indicated his/her understanding and acceptance.     Plan Discussed with: CRNA, Anesthesiologist and Surgeon  Anesthesia Plan Comments:         Anesthesia Quick Evaluation  

## 2017-11-24 NOTE — Progress Notes (Signed)
Rick DuffDahquaisha Gerald DexterRasberry is a 26 y.o. G1P0 at 5837w6d by LMP admitted for active labor.  Subjective:  Feels rectal pressure occasionally. Discussed AROM to encourage labor progress and agrees.  Objective: Vitals:   11/24/17 0434 11/24/17 0458 11/24/17 0531 11/24/17 0837  BP: 134/67  (!) 138/57 108/76  Pulse: 78  78 84  Resp: 18  16   Temp: 98.6 F (37 C)  98.1 F (36.7 C) 99.1 F (37.3 C)  TempSrc: Oral  Oral Oral  Weight:  112.9 kg (249 lb) 112.9 kg (249 lb)   Height:  5\' 5"  (1.651 m) 5\' 5"  (1.651 m)     No intake/output data recorded. No intake/output data recorded.   FHT:  FHR: 130 bpm, variability: moderate,  accelerations:  Present,  decelerations:  Absent UC:   irregular, every 2-4 minutes SVE:   Dilation: 8.5 Effacement (%): 100 Station: -2 Exam by:: Colon Flattery. Meelah Tallo CNM AROM, clear AF, ? acynclitic  Labs:   Recent Labs    11/24/17 0730  WBC 10.4  HGB 11.3*  HCT 35.6*  PLT 218    Assessment / Plan: Protracted active phase  Labor: AROM augmentation. Up to BR, will try hands/knees position when returning to bed Preeclampsia:  no signs or symptoms of toxicity Fetal Wellbeing:  Category I Pain Control:  Labor support without medications I/D:  GBS neg Anticipated MOD:  NSVD / cautious  Neta Mendsaniela C Akshat Minehart, CNM, MSN 11/24/2017, 9:24 AM

## 2017-11-24 NOTE — Progress Notes (Signed)
S:  More pressure - no pain  O:  VS: Blood pressure (!) 143/107, pulse (!) 131, temperature 99.1 F (37.3 C), temperature source Oral, resp. rate 18, height 5\' 5"  (1.651 m), weight 112.9 kg (249 lb), SpO2 100 %.        FHR : baseline 135 / variability moderate / accelerations absent / variable decelerations        Toco: contractions every 2-4 minutes / pitocin 6 mu/min         Cervix : worsening edema with dilation down to 7cm / thick posterior with edema / no fetal descent - remains OP        Foley dark yellow urine despite LR bolus x 2        A: arrest of active labor with arrest of fetal descent     FHR category 2  P: consult MD for C-section delivery      reviewed rationale for C-section - no other intervention to help resolve obstructed labor with edematous cervix      reviewed C-section risks and overall procedure with anticipatory guidance for immediate postpartum course       Dr Billy Coastaavon notification of OR to post case /pre-op orders received    Marlinda MikeBAILEY, Itzae Miralles CNM, MSN, [facility] 11/24/2017, 2:17 PM

## 2017-11-24 NOTE — Anesthesia Pain Management Evaluation Note (Signed)
  CRNA Pain Management Visit Note  Patient: Monica Glover, 26 y.o., female  "Hello [facility] am a [facility]. We have an anesthesia team available at all times to provide care throughout the hospital, including epidural management and anesthesia for C-section. I don't know your plan for the delivery whether it a natural birth, water birth, IV sedation, nitrous supplementation, doula or epidural, but we want to meet your pain goals."   1.Was your pain managed to your expectations on prior hospitalizations?   Yes   2.What is your expectation for pain management during this hospitalization?     Water tub  3.How can we help you reach that goal? Be available if needed  Record the patient's initial score and the patient's pain goal.   Pain: 10  Pain Goal: 10 [facility] wants you to be able to say your pain was always managed very well.  Monica Glover 11/24/2017

## 2017-11-24 NOTE — Progress Notes (Signed)
S:  no pain now - only pressure with ctx       comfortable with epidural  O:  VS: Blood pressure 131/78, pulse 82, temperature 99.1 F (37.3 C), temperature source Oral, resp. rate 18, height 5\' 5"  (1.651 m), weight 112.9 kg (249 lb), SpO2 100 %.        FHR : baseline 135 / variability moderate / accelerations 10x10 / variable decelerations        Toco: contractions every 3-4 minutes / moderate to strong        Cervix : 8-9cm / 90% / vtx -2 LOT        Membranes: clear fluid with small show  A: active labor     FHR category 2  P: epidural effective      place foley      start pitocin and position on peanut ball to facilitate fetal rotation      Dr Billy Coastaavon updated - recheck 2 hours for progression - monitor FHR continuously   Marlinda MikeBAILEY, Latifa Noble CNM, MSN, [facility] 11/24/2017, 12:00 PM

## 2017-11-24 NOTE — MAU Note (Signed)
Pt reports uc's since 0000 and some bleeding @ 0230. + FM

## 2017-11-24 NOTE — Op Note (Signed)
Cesarean Section Procedure Note  Indications: failure to progress: arrest of dilation  Pre-operative Diagnosis: 40 week 6 day pregnancy.  Post-operative Diagnosis: same  Surgeon: Lenoard AdenAAVON,Briant Angelillo J   Assistants: Kathi LudwigBAiley, CNM, FACM  Anesthesia: Epidural anesthesia and Local anesthesia 0.25.% bupivacaine  ASA Class: 2  Procedure Details  The patient was seen in the Holding Room. The risks, benefits, complications, treatment options, and expected outcomes were discussed with the patient.  The patient concurred with the proposed plan, giving informed consent. The risks of anesthesia, infection, bleeding and possible injury to other organs discussed. Injury to bowel, bladder, or ureter with possible need for repair discussed. Possible need for transfusion with secondary risks of hepatitis or HIV acquisition discussed. Post operative complications to include but not limited to DVT, PE and Pneumonia noted. The site of surgery properly noted/marked. The patient was taken to Operating Room # 9, identified as Monica Glover and the procedure verified as C-Section Delivery. A Time Out was held and the above information confirmed.  After induction of anesthesia, the patient was draped and prepped in the usual sterile manner. A Pfannenstiel incision was made and carried down through the subcutaneous tissue to the fascia. Fascial incision was made and extended transversely using Mayo scissors. The fascia was separated from the underlying rectus tissue superiorly and inferiorly. The peritoneum was identified and entered. Peritoneal incision was extended longitudinally. Filmy hemorrhagiac adhesions of bladder flap to LUS. The utero-vesical peritoneal reflection was incised transversely and the bladder flap was bluntly freed from the lower uterine segment. A low transverse uterine incision(Kerr hysterotomy) was made. Delivered from OP, asynclitic presentation was a  female with Apgar scores of pending at one  minute and pending at five minutes. Bulb suctioning gently performed. Neonatal team in attendance.After the umbilical cord was clamped and cut cord blood was obtained for evaluation. The placenta was removed intact and appeared normal. The uterus was curetted with a dry lap pack. Good hemostasis was noted.The uterine outline, tubes and ovaries appeared normal. The uterine incision was closed with running locked sutures of 0 Monocryl x 2 layers. Hemostasis was observed. Lavage was carried out until clear.The parietal peritoneum was closed with a running 2-0 Monocryl suture. The fascia was then reapproximated with running sutures of 0 Monocryl. The skin was reapproximated with 3-0 monocryl after Greenleaf closure with 2-0 plain.  Instrument, sponge, and needle counts were correct prior the abdominal closure and at the conclusion of the case.   Findings: Adhesions as noted. Nl adnexa  Estimated Blood Loss:  800         Drains: foley                 Specimens: placenta                 Complications:  None; patient tolerated the procedure well.         Disposition: PACU - hemodynamically stable.         Condition: stable  Attending Attestation: I performed the procedure.

## 2017-11-25 ENCOUNTER — Encounter (HOSPITAL_COMMUNITY): Payer: Self-pay | Admitting: Obstetrics and Gynecology

## 2017-11-25 LAB — CBC
HEMATOCRIT: 31.2 % — AB (ref 36.0–46.0)
HEMOGLOBIN: 9.9 g/dL — AB (ref 12.0–15.0)
MCH: 28.9 pg (ref 26.0–34.0)
MCHC: 31.7 g/dL (ref 30.0–36.0)
MCV: 91.2 fL (ref 78.0–100.0)
Platelets: 165 10*3/uL (ref 150–400)
RBC: 3.42 MIL/uL — AB (ref 3.87–5.11)
RDW: 15.6 % — ABNORMAL HIGH (ref 11.5–15.5)
WBC: 14.2 10*3/uL — ABNORMAL HIGH (ref 4.0–10.5)

## 2017-11-25 MED ORDER — IBUPROFEN 800 MG PO TABS
800.0000 mg | ORAL_TABLET | Freq: Three times a day (TID) | ORAL | Status: DC
  Administered 2017-11-25 – 2017-11-26 (×4): 800 mg via ORAL
  Filled 2017-11-25 (×5): qty 1

## 2017-11-25 MED ORDER — MAGNESIUM OXIDE 400 (241.3 MG) MG PO TABS
400.0000 mg | ORAL_TABLET | Freq: Every day | ORAL | Status: DC
  Administered 2017-11-25 – 2017-11-26 (×2): 400 mg via ORAL
  Filled 2017-11-25 (×3): qty 1

## 2017-11-25 MED ORDER — POLYSACCHARIDE IRON COMPLEX 150 MG PO CAPS
150.0000 mg | ORAL_CAPSULE | Freq: Every day | ORAL | Status: DC
  Administered 2017-11-26: 150 mg via ORAL
  Filled 2017-11-25: qty 1

## 2017-11-25 NOTE — Anesthesia Postprocedure Evaluation (Signed)
Anesthesia Post Note  Patient: Monica Glover  Procedure(s) Performed: CESAREAN SECTION (N/A )     Patient location during evaluation: Mother Baby Anesthesia Type: Epidural Level of consciousness: awake Pain management: pain level controlled Vital Signs Assessment: post-procedure vital signs reviewed and stable Respiratory status: spontaneous breathing Cardiovascular status: stable Postop Assessment: patient able to bend at knees, epidural receding, no headache and no backache Anesthetic complications: no    Last Vitals:  Vitals:   11/25/17 0600 11/25/17 0800  BP: 125/67   Pulse: 78   Resp: 18   Temp: 36.7 C   SpO2: 98% 98%    Last Pain:  Vitals:   11/25/17 0710  TempSrc:   PainSc: 0-No pain   Pain Goal: Patients Stated Pain Goal: 5 (11/24/17 0531)               Edison PaceWILKERSON,Dakisha Schoof

## 2017-11-25 NOTE — Lactation Note (Signed)
This note was copied from a baby's chart. Lactation Consultation Note: Lactation brochure given and basic breastfeeding teaching done.  Mother breastfeeding in football hold when I arrived in the room. Infant sucking on his lips with top lip tucked in. Assist mother with better positioning. Observed that mothers nipple was  Pinched. Mothers nipples are flat and firm slightly. Mother was given a hand pump with a #24 flange. Mother advised to pre-pump to firm nipples.  Mother assist with cross cradle hold. Infant latched on with deeper latch. Mother reports that latch feels stronger and she feels more tugging.  Discussed the use of a nipple shield as needed. Discussed cluster feeding and cue base feeding. Mother advised to breastfeed infant at least 8-12 times. Mother was informed of all available LC services.   Patient Name: Monica Glover ZOXWR'UToday's Date: 11/25/2017 Reason for consult: Initial assessment   Maternal Data    Feeding Feeding Type: Breast Fed Length of feed: 10 min  LATCH Score Latch: Grasps breast easily, tongue down, lips flanged, rhythmical sucking.  Audible Swallowing: A few with stimulation  Type of Nipple: Flat  Comfort (Breast/Nipple): Soft / non-tender  Hold (Positioning): Assistance needed to correctly position infant at breast and maintain latch.  LATCH Score: 7  Interventions Interventions: Breast massage;Adjust position;Support pillows;Position options  Lactation Tools Discussed/Used     Consult Status Consult Status: Follow-up Date: 11/26/17 Follow-up type: In-patient    [name], [name] 11/25/2017, 4:28 PM

## 2017-11-25 NOTE — Progress Notes (Signed)
POSTOPERATIVE DAY # 1 S/P primary CS - arrest of dilation and descent  S:         Reports feeling tired             Tolerating po intake / no nausea / no vomiting / no flatus / no BM             Bleeding is light             Pain controlled with long-acting narcotic and Motrin             Up ad lib / ambulatory/ voiding QS  Newborn breast feeding / Circumcision planned  O:  VS: BP 125/67 (BP Location: Right Arm)   Pulse 78   Temp 98 F (36.7 C) (Oral)   Resp 18   Ht 5\' 5"  (1.651 m)   Wt 112.9 kg (249 lb)   SpO2 98%   Breastfeeding? Unknown   BMI 41.44 kg/m    LABS:               Recent Labs    11/24/17 0730 11/25/17 0522  WBC 10.4 14.2*  HGB 11.3* 9.9*  PLT 218 165               Bloodtype: --/--/O POS, O POS (12/10 0730)  Rubella:   Immune                                         I&O: Intake/Output      12/10 0701 - 12/11 0700 12/11 0701 - 12/12 0700   I.V. (mL/kg) 1300 (11.5)    Other 500    Total Intake(mL/kg) 1800 (15.9)    Urine (mL/kg/hr) 2175 (0.8)    Blood 557    Total Output 2732    Net -932                    Physical Exam:             Alert and Oriented X3  Lungs: Clear and unlabored  Heart: regular rate and rhythm / no mumurs  Abdomen: soft, non-tender, non-distended, hypoactive BS             Fundus: firm, non-tender, Ueven             Dressing intact honeycomb              Incision:  approximated with suture / no erythema / no ecchymosis / no drainage  Perineum: intact  Lochia: light  Extremities: trace edema, no calf pain or tenderness, SCD in place  A:        POD # 1 S/P primary CS - arrest of dilation and descent            ABL anemia  P:        Routine postoperative care              Start magnesium and iron             Discuss circumcision plan tomorrow (inpatient vs [facility]BC with Dr Billy Coastaavon)             Advance activity today - consider early DC tomorrow if ambulating    Marlinda MikeBAILEY, TANYA CNM, MSN, [facility] 11/25/2017, 10:14 AM

## 2017-11-26 MED ORDER — POLYSACCHARIDE IRON COMPLEX 150 MG PO CAPS: 150.0000 mg | ORAL_CAPSULE | Freq: Every day | ORAL | Status: DC

## 2017-11-26 MED ORDER — SIMETHICONE 80 MG PO CHEW: 80.0000 mg | CHEWABLE_TABLET | Freq: Three times a day (TID) | ORAL | 0 refills | Status: DC

## 2017-11-26 MED ORDER — MAGNESIUM OXIDE 400 (241.3 MG) MG PO TABS: 400.0000 mg | ORAL_TABLET | Freq: Every day | ORAL | Status: DC

## 2017-11-26 MED ORDER — IBUPROFEN 800 MG PO TABS: 800.0000 mg | ORAL_TABLET | Freq: Three times a day (TID) | ORAL | 0 refills | Status: DC

## 2017-11-26 MED ORDER — SENNOSIDES-DOCUSATE SODIUM 8.6-50 MG PO TABS: 2.0000 | ORAL_TABLET | ORAL | Status: DC

## 2017-11-26 MED ORDER — ACETAMINOPHEN 325 MG PO TABS: 650.0000 mg | ORAL_TABLET | ORAL | Status: DC | PRN

## 2017-11-26 MED ORDER — OXYCODONE-ACETAMINOPHEN 5-325 MG PO TABS: 1.0000 | ORAL_TABLET | ORAL | 0 refills | Status: DC | PRN

## 2017-11-26 MED ORDER — COCONUT OIL OIL: 1.0000 "application " | TOPICAL_OIL | 0 refills | Status: DC | PRN

## 2017-11-26 NOTE — Lactation Note (Addendum)
This note was copied from a baby's chart. Lactation Consultation Note  Patient Name: Monica Glover XBJYN'WToday's Date: 11/26/2017 Reason for consult: Follow-up assessment   Baby 41 hours old and sleeping. Mother states she is currently in a lot of pain due to C/s. Mother states baby is latching well.  She is using #24NS to latch baby on R side only at first and then she takes it off due to flat nipple. Discussed if she is using NS on both breasts and with regularity she will need to post pumping w/ DEBP.  Mother states she is only occasionally using NS. Suggest calling LC to view latch when baby wakes. Baby has not fed in approx 4 hours.  Parents state that once mother feels more comfortable she will wake baby to feed. Mom encouraged to feed baby 8-12 times/24 hours and with feeding cues.  Reviewed engorgement care and monitoring voids/stools.     Maternal Data    Feeding    LATCH Score                   Interventions    Lactation Tools Discussed/Used     Consult Status Consult Status: Complete    [name], Ruth Boschen 11/26/2017, 9:36 AM

## 2017-11-26 NOTE — Lactation Note (Signed)
This note was copied from a baby's chart. Lactation Consultation Note  Patient Name: Monica Glover ONGEX'BToday's Date: 11/26/2017 Reason for consult: Follow-up assessment   Mother easily latched baby in football hold on L side. Mother states she is having more trouble latching on R side due to nipple being flatter. Reviewed manual pump use to evert nipple. No further questions or problems.   Maternal Data    Feeding Feeding Type: Breast Fed Length of feed: 15 min(on and off)  LATCH Score Latch: Grasps breast easily, tongue down, lips flanged, rhythmical sucking.  Audible Swallowing: A few with stimulation  Type of Nipple: Everted at rest and after stimulation  Comfort (Breast/Nipple): Soft / non-tender  Hold (Positioning): Assistance needed to correctly position infant at breast and maintain latch.  LATCH Score: 8  Interventions Interventions: Hand pump;Breast compression  Lactation Tools Discussed/Used     Consult Status Consult Status: Complete    [name], Ruth Boschen 11/26/2017, 11:59 AM

## 2017-11-26 NOTE — Discharge Summary (Signed)
OB Discharge Summary     Patient Name: Monica MichaelsDahquaisha Greenwell DOB: 05/24/1991 MRN: [id number]  Date of admission: 11/24/2017 Delivering MD: Olivia MackieAAVON, RICHARD   Date of discharge: 11/26/2017  Admitting diagnosis: CTX Intrauterine pregnancy: 6631w6d     Secondary diagnosis:  Principal Problem:   Cesarean delivery 12/11 Active Problems:   Arrested active phase of labor   Postpartum care following cesarean delivery      Discharge diagnosis: Term Pregnancy Delivered and Post-op day 2, stable , acute blood loss anemia                                                                                             Augmentation: Pitocin  Complications: None  Hospital course:  Onset of Labor With Unplanned C/S  26 y.o. yo G1P1001 at 6231w6d was admitted in Active Labor on 11/24/2017. Patient had a labor course significant for arrest of dilation at 8 cm which did not resolve with Pitocin augmentation and epidural sedation. Membrane Rupture Time/Date: 9:16 AM ,11/24/2017   The patient went for cesarean section due to Arrest of Dilation and Arrest of Descent, and delivered a Viable infant,11/24/2017  Details of operation can be found in separate operative note. Patient had an uncomplicated postpartum course.  She is ambulating,tolerating a regular diet, passing flatus, and urinating well.  Patient is discharged home in stable condition 11/26/17.  Physical exam  Vitals:   11/25/17 1000 11/25/17 1230 11/25/17 1745 11/26/17 0647  BP: 127/64  128/69 (!) 119/53  Pulse: 86  81 62  Resp: 18  18 16   Temp: 98.2 F (36.8 C)  98.1 F (36.7 C) 98.3 F (36.8 C)  TempSrc: Oral  Oral   SpO2: 98% 98%    Weight:      Height:       General: alert, cooperative and no distress Lochia: appropriate Uterine Fundus: firm Incision: Healing well with no significant drainage, Dressing is clean, dry, and intact DVT Evaluation: No cords or calf tenderness. +1 pedal edema Labs: Lab Results  Component Value Date    WBC 14.2 (H) 11/25/2017   HGB 9.9 (L) 11/25/2017   HCT 31.2 (L) 11/25/2017   MCV 91.2 11/25/2017   PLT 165 11/25/2017   No flowsheet data found.  Discharge instruction: per After Visit Summary and "Baby and Me Booklet".  After visit meds:  Allergies as of 11/26/2017   No Known Allergies     Medication List    STOP taking these medications   Evening Primrose Oil 500 MG Caps     TAKE these medications   acetaminophen 325 MG tablet Commonly known as:  TYLENOL Take 2 tablets (650 mg total) by mouth every 4 (four) hours as needed (for pain scale < 4).   albuterol 108 (90 Base) MCG/ACT inhaler Commonly known as:  PROVENTIL HFA;VENTOLIN HFA Inhale into the lungs every 6 (six) hours as needed for wheezing or shortness of breath.   coconut oil Oil Apply 1 application topically as needed.   ibuprofen 800 MG tablet Commonly known as:  ADVIL,MOTRIN Take 1 tablet (800 mg total) by mouth 3 (three) times daily.  iron polysaccharides 150 MG capsule Commonly known as:  NIFEREX Take 1 capsule (150 mg total) by mouth daily.   magnesium oxide 400 (241.3 Mg) MG tablet Commonly known as:  MAG-OX Take 1 tablet (400 mg total) by mouth daily.   oxyCODONE-acetaminophen 5-325 MG tablet Commonly known as:  PERCOCET/ROXICET Take 1 tablet by mouth every 4 (four) hours as needed (pain scale 4-7).   Prenatal Vitamins 28-0.8 MG Tabs Take 1 tablet by mouth daily.   senna-docusate 8.6-50 MG tablet Commonly known as:  Senokot-S Take 2 tablets by mouth daily. Start taking on:  11/27/2017   simethicone 80 MG chewable tablet Commonly known as:  MYLICON Chew 1 tablet (80 mg total) by mouth 3 (three) times daily after meals.       Diet: routine diet  Activity: Advance as tolerated. Pelvic rest for 6 weeks.   [facility] Postpartum contraception: Not Discussed  Newborn Data: Live born female  Birth Weight: 7 lb 13.2 oz (3550 g) APGAR: 4,  7  Newborn Delivery   Birth date/time:  11/24/2017 15:40:00 Delivery type:  C-Section, Low Transverse C-section categorization:  Primary     Baby Feeding: Breast Disposition:home with mother   11/26/2017 Neta Mendsaniela C Paul, CNM

## 2020-07-04 ENCOUNTER — Other Ambulatory Visit: Payer: Self-pay

## 2020-07-04 ENCOUNTER — Ambulatory Visit
Admission: RE | Admit: 2020-07-04 | Discharge: 2020-07-04 | Disposition: A | Discharge status: Home / Self Care | Payer: Medicaid Other | Source: Ambulatory Visit | Attending: Family Medicine | Admitting: Family Medicine

## 2020-07-04 ENCOUNTER — Other Ambulatory Visit: Payer: Self-pay | Admitting: Family Medicine

## 2020-07-04 DIAGNOSIS — S99929A Unspecified injury of unspecified foot, initial encounter: Secondary | ICD-10-CM

## 2021-02-25 IMAGING — CR DG TOE GREAT 2+V*L*
3 series · 3 of 3 positions shown · non-contrast
Comparison: None.

CLINICAL DATA: Pain status post fall

EXAM:
LEFT GREAT TOE

[x toes ap left]
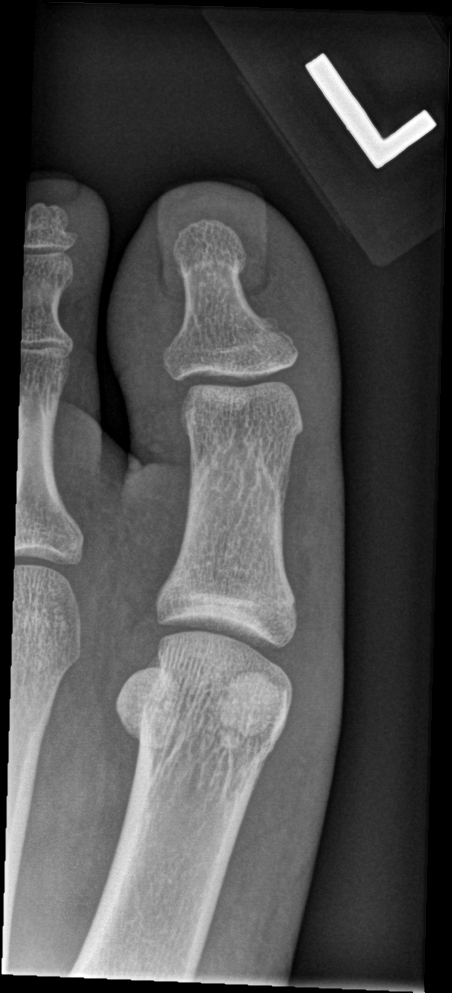

[x toes obl left]
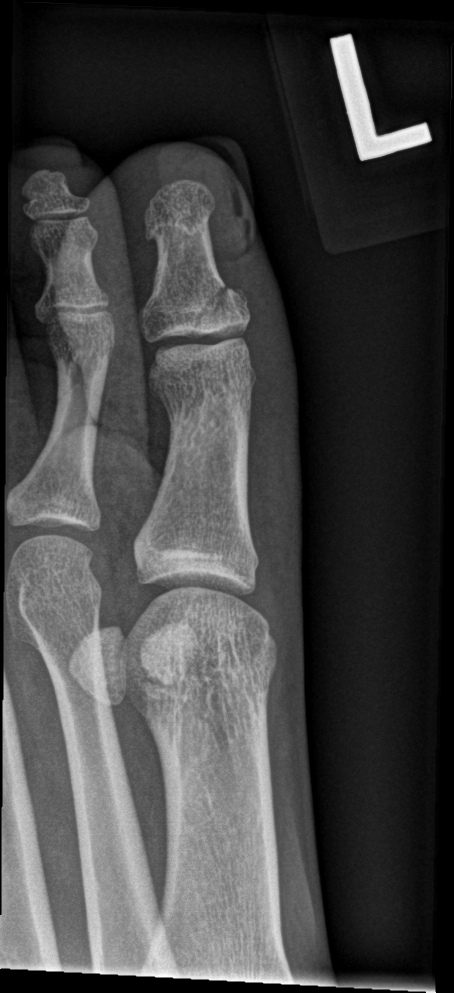

[x toes lat left]
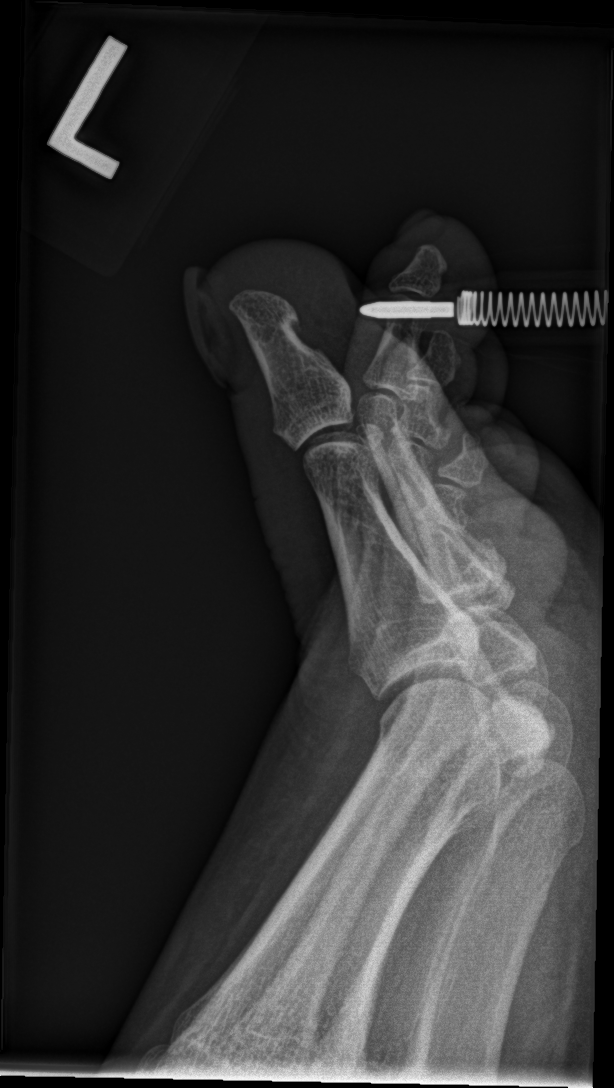

[3 of 3 positions shown; findings below may reference images not displayed]

FINDINGS: There is an acute oblique fracture through the distal phalanx of the
first digit. The fracture extends to the articular surface of the
interphalangeal joint. There is surrounding soft tissue swelling.
There is no frank dislocation.
IMPRESSION: Acute nondisplaced intra-articular fracture of the distal phalanx of
the first digit.

## 2021-02-25 IMAGING — CR DG TOE GREAT 2+V*R*
3 series · 3 of 3 positions shown · non-contrast
Comparison: None.

CLINICAL DATA: Pain

EXAM:
RIGHT GREAT TOE

[x toes ap right]
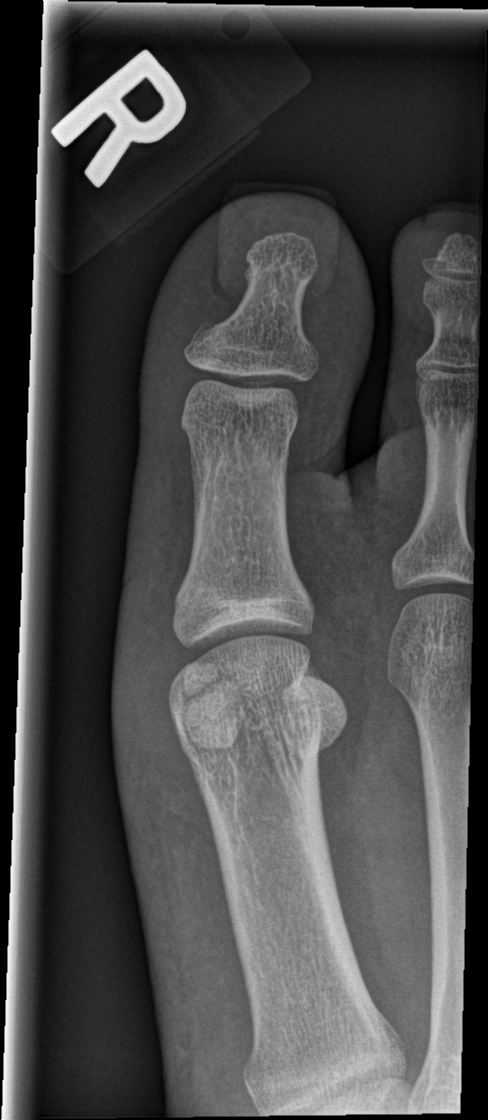

[x toes obl right]
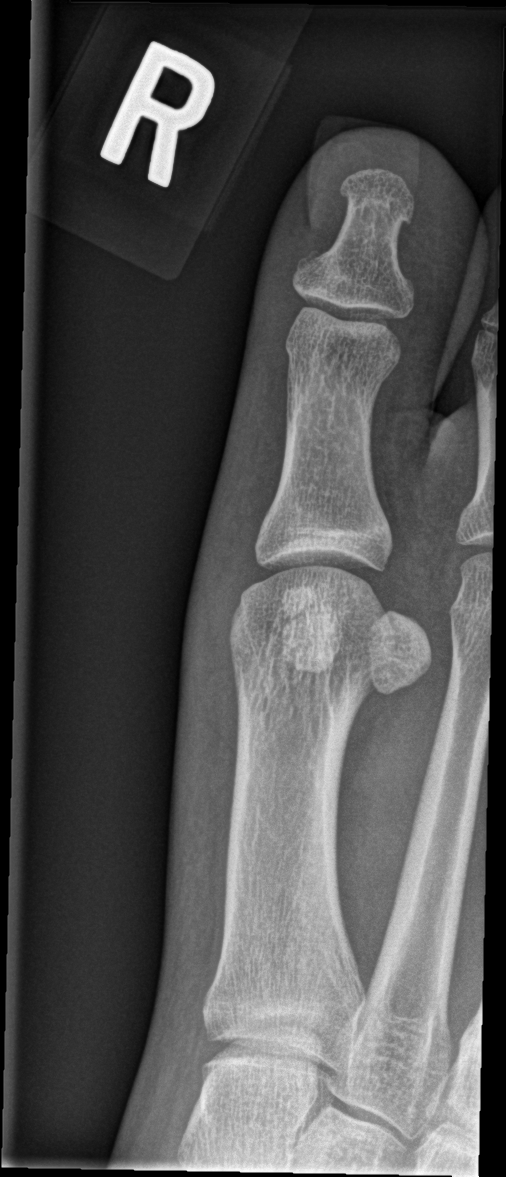

[x toes lat right]
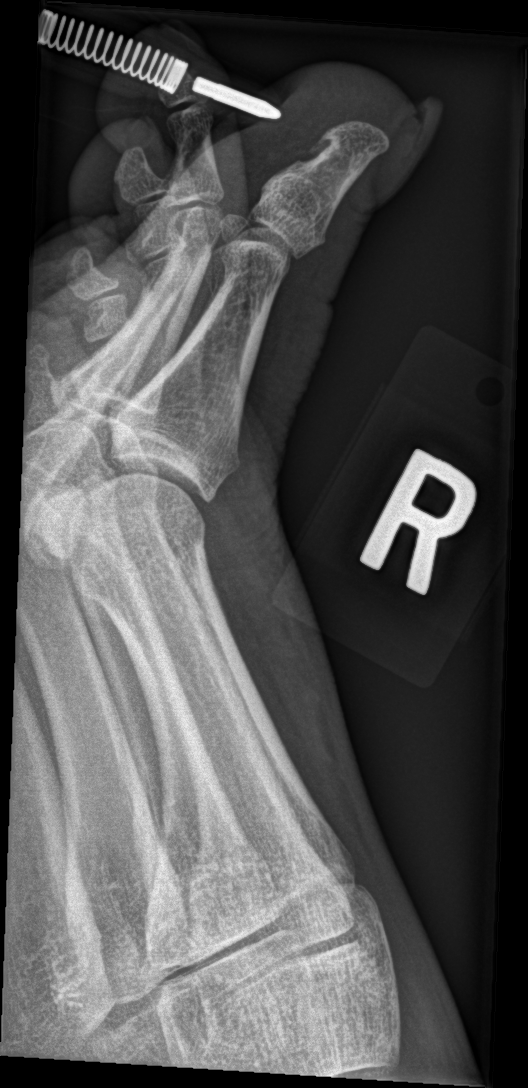

[3 of 3 positions shown; findings below may reference images not displayed]

FINDINGS: There is no evidence of fracture or dislocation. There is no
evidence of arthropathy or other focal bone abnormality. Soft
tissues are unremarkable.
IMPRESSION: Negative.

## 2023-04-10 ENCOUNTER — Ambulatory Visit
Admission: EM | Admit: 2023-04-10 | Discharge: 2023-04-10 | Disposition: A | Discharge status: Home / Self Care | Payer: Medicaid Other

## 2023-04-10 ENCOUNTER — Encounter (HOSPITAL_COMMUNITY): Payer: Self-pay

## 2023-04-10 ENCOUNTER — Other Ambulatory Visit: Payer: Self-pay

## 2023-04-10 ENCOUNTER — Emergency Department (HOSPITAL_COMMUNITY): Payer: Medicaid Other

## 2023-04-10 ENCOUNTER — Emergency Department (HOSPITAL_COMMUNITY)
Admission: EM | Admit: 2023-04-10 | Discharge: 2023-04-10 | Disposition: A | Discharge status: Home / Self Care | Payer: Medicaid Other | Attending: Emergency Medicine | Admitting: Emergency Medicine

## 2023-04-10 DIAGNOSIS — R519 Headache, unspecified: Secondary | ICD-10-CM

## 2023-04-10 LAB — CBC WITH DIFFERENTIAL/PLATELET
Abs Immature Granulocytes: 0.01 10*3/uL (ref 0.00–0.07)
Basophils Absolute: 0 10*3/uL (ref 0.0–0.1)
Basophils Relative: 0 %
Eosinophils Absolute: 0.1 10*3/uL (ref 0.0–0.5)
Eosinophils Relative: 2 %
HCT: 36.4 % (ref 36.0–46.0)
Hemoglobin: 11.5 g/dL — ABNORMAL LOW (ref 12.0–15.0)
Immature Granulocytes: 0 %
Lymphocytes Relative: 41 %
Lymphs Abs: 2 10*3/uL (ref 0.7–4.0)
MCH: 28.5 pg (ref 26.0–34.0)
MCHC: 31.6 g/dL (ref 30.0–36.0)
MCV: 90.1 fL (ref 80.0–100.0)
Monocytes Absolute: 0.5 10*3/uL (ref 0.1–1.0)
Monocytes Relative: 10 %
Neutro Abs: 2.3 10*3/uL (ref 1.7–7.7)
Neutrophils Relative %: 47 %
Platelets: 217 10*3/uL (ref 150–400)
RBC: 4.04 MIL/uL (ref 3.87–5.11)
RDW: 15.3 % (ref 11.5–15.5)
WBC: 4.9 10*3/uL (ref 4.0–10.5)
nRBC: 0 % (ref 0.0–0.2)

## 2023-04-10 LAB — BASIC METABOLIC PANEL
Anion gap: 7 (ref 5–15)
BUN: <5 mg/dL — ABNORMAL LOW (ref 6–20)
CO2: 23 mmol/L (ref 22–32)
Calcium: 8.5 mg/dL — ABNORMAL LOW (ref 8.9–10.3)
Chloride: 104 mmol/L (ref 98–111)
Creatinine, Ser: 0.84 mg/dL (ref 0.44–1.00)
GFR, Estimated: >60 mL/min (ref >60)
Glucose, Bld: 93 mg/dL (ref 70–99)
Potassium: 4.1 mmol/L (ref 3.5–5.1)
Sodium: 134 mmol/L — ABNORMAL LOW (ref 135–145)

## 2023-04-10 MED ORDER — SUMATRIPTAN SUCCINATE 100 MG PO TABS: 100.0000 mg | ORAL_TABLET | ORAL | 0 refills | Status: DC | PRN

## 2023-04-10 MED ORDER — ACETAMINOPHEN 500 MG PO TABS
1000.0000 mg | ORAL_TABLET | Freq: Once | ORAL | Status: DC
  Filled 2023-04-10: qty 2

## 2023-04-10 NOTE — ED Triage Notes (Signed)
Pt c/o head pain started yesterday afternoon. Pt states got dizzy yesterday, but it has resolved. Pt denies vision changes. Pt denies N/V, photosensitivity.

## 2023-04-10 NOTE — Discharge Instructions (Signed)
ZOXWRUEAVW Ege  Thank you for allowing [place] to take care of you today.  You came to the Emergency Department today because you have had episodes of sharp severe headache in the right side of your head.  Here in the emergency department we checked your blood work, you do not have elevation of your infection blood cell count which is your white blood cell count.  You do not have any abnormal blood salt levels.  We also did a CT of your head that did not show any signs concerning for bleeding, infection, or broken bones.  On exam you are moving all parts of your body appropriately.  It is unclear what exactly is causing your symptoms, however it is unlikely to be one of the emergency causes of headache.  It is possible that this is a migraine headache or cluster headache.  Given your labs and imaging is reassuring, you are safe to follow-up outpatient with a headache specialist, neurologist.  We are giving you referral to neurology.  We are also giving you a prescription for medication called Imitrex.  This is a medication that you can take should he develop another headache that should help with a headache, please do not take this medication if there is any chance that you are pregnant, and or if you are breast-feeding.  Please return to the Emergency Department or call 911 if you experience have worsening of your symptoms, or do not get better, chest pain, shortness of breath, severe or significantly worsening pain, high fever, severe confusion, pass out or have any reason to think that you need emergency medical care.   We hope you feel better soon.   Curley Spice, MD Department of Emergency Medicine [facility]

## 2023-04-10 NOTE — ED Triage Notes (Signed)
Pt presents to [facility] with co of sharp pain in head last night when she was leaving [employer]. Pt reports it happened again this morning. Pt reports is sharp.pt reports no otc meds

## 2023-04-10 NOTE — ED Notes (Signed)
Patient is being discharged from the Urgent Care and sent to the Emergency Department via private vehicle . Per Lavetta Nielsen patient is in need of higher level of care due to further evaluation. Patient is aware and verbalizes understanding of plan of care.  Vitals:   04/10/23 1221 04/10/23 1225  BP:  116/71  Pulse: 60   Resp: 19   Temp: 97.9 F (36.6 C)   SpO2: 99%

## 2023-04-10 NOTE — ED Provider Triage Note (Signed)
Emergency Medicine Provider Triage Evaluation Note  Marilene Vath , a 32 y.o. female  was evaluated in triage.  Pt complains of sharp right-sided headache which began suddenly yesterday.  Patient states she was driving to the grocery store and got out of her car and had a sudden sharp pain in the right side of her head near the temple which made her sit down.  The pain has persisted.  She went to an urgent care earlier this morning who recommended she come to the emergency department for CT scan.  She denies nausea, vomiting, photosensitivity.  Patient also denies history of migraines  Review of Systems  Positive: As above Negative: As above  Physical Exam  BP 129/67 (BP Location: Right Arm)   Pulse 64   Temp 98.3 F (36.8 C) (Oral)   Resp 16   LMP 03/05/2023 (Approximate)   SpO2 100%  Gen:   Awake, no distress   Resp:  Normal effort  MSK:   Moves extremities without difficulty  Other:    Medical Decision Making  Medically screening exam initiated at 1:26 PM.  Appropriate orders placed.  ZOXWRUEAVW Gaughran was informed that the remainder of the evaluation will be completed by another provider, this initial triage assessment does not replace that evaluation, and the importance of remaining in the ED until their evaluation is complete.     Darrick Grinder, PA-C 04/10/23 1329

## 2023-04-10 NOTE — Discharge Instructions (Addendum)
Please go to one of the provided emergency departments for further evaluation and management as you may need imaging of your head.

## 2023-04-10 NOTE — ED Provider Notes (Signed)
[facility]ENC[facility] DEPARTMENT AT [facility] Provider Note  Medical Decision Making   HPI: Monica Glover is a 32 y.o. female with no perinent history who presents complaining of headache. Patient arrived via POV.  History provided by patient.  No interpreter required for this encounter.  Patient reports that she developed a new type of headache beginning on 4/24.  Reports that she was dropping off her son at school when she noted a sharp pain in her right temporal region.  It was associated with malaise and slight dizziness.  She denies any recent trauma, falls, any specific aggravating or alleviating factor.  The episode lasted approximately 1 hour then resolved spontaneously.  No associated weakness, difficulty speaking, changes in vision, no fevers, chills, chest pain, shortness of breath, patient reports she is up-to-date on vaccinations.  Reports that she had an additional episode this morning, as well as an episode while in the ED.  Reports that she previously has had headaches, though the unilateral aspect of this as well as the more severe nature is different.  She denies any photophobia or phonophobia.  ROS: As per HPI. Please see MAR for complete past medical history, surgical history, and social history.   Physical exam is pertinent for normal neurologic exam.   The differential includes but is not limited to tension HA, analgesia rebound HA, cluster HA, migraine HA, CSF leak, intracranial mass, intracranial hemorrhage, vertebral artery or carotid artery dissection, meningitis, temporal arteritis, CO poisoning, intracranial hypertension, herpes encephalitis, sinusitis.  Additional history obtained from: None External records from outside source obtained and reviewed including: None  ED provider interpretation of ECG: Not indicated  ED provider interpretation of radiology/imaging: Reviewed CT of the head, I do not appreciate any mass lesions, midline shift, ICH or  display skull fracture  Labs ordered were interpreted by myself as well as my attending and were incorporated into the medical decision making process for this patient.  ED provider interpretation of labs: CBC with mild anemia to 11.5, consistent to comparison to prior.  BMP without AKI or emergent electrolyte derangement  Interventions: Tylenol  See the EMR for full details regarding lab and imaging results. After reviewing results of initial testing, CT head obtained in triage, no acute intracranial pathology per my read nor per radiology's read.  Additionally CBC and BMP reassuring.  Doubt ICH given CT head unrevealing for ICH. Pt has no focal neuro sx, neuro exam is wnl, no visual disturbance, no doubt intracranial abnormality such as aneurysm, mass or intracranial bleed. Do not suspect vertebral artery or carotid artery dissection. No infectious sx, no meningismus, afebrile, no ams, doubt meningitis.  No sinus ttp, doubt sinusitis. There is nothing on hx or exam to give me c/f dental or ear etiology. No tenderness over temporal artery, doubt temporal arteritis. No tearing or eye pain, doubt cluster HA. Nothing in hx to concern me for CO poisoning.  No hyperesthesia or rash to concern me for zoster.    Etiology likely: Migraine/tension headache, etiology not fully elucidated, however given episodic nature, normal CT head, reassuring labs, doubt emergent etiology, discussed this at the bedside with patient including fact the underlying pathology is not fully elucidated.  Will prescribe Imitrex, and will refer to neurology.      Consults: Not indicated  Disposition: DISCHARGE: I believe that the patient is safe for discharge home with outpatient follow-up. Patient was informed of all pertinent physical exam, laboratory, and imaging findings. Patient's suspected etiology of their symptom presentation  was discussed with the patient and all questions were answered. We discussed following up with  PCP and neurology. I provided thorough ED return precautions. The patient feels safe and comfortable with this plan.  The plan for this patient was discussed with Dr. Silverio Lay, who voiced agreement and who oversaw evaluation and treatment of this patient.  Clinical Impression:  1. Acute nonintractable headache, unspecified headache type    Discharge  Therapies: These medications and interventions were provided for the patient while in the ED. Medications  acetaminophen (TYLENOL) tablet 1,000 mg (1,000 mg Oral Not Given 04/10/23 1607)    MDM generated using voice dictation software and may contain dictation errors.  Please contact me for any clarification or with any questions.  Clinical Complexity A medically appropriate history, review of systems, and physical exam was performed.  Collateral history obtained from: None I personally reviewed the labs, EKG, imaging as discussed above. Patient's presentation is most consistent with acute complicated illness / injury requiring diagnostic workup Considered and ruled out life and body threatening conditions  Treatment: Outpatient follow-up Medications: Prescription Discussed patient's care with providers from the following different specialties: None  Physical Exam   ED Triage Vitals  Enc Vitals Group     BP 04/10/23 1319 129/67     Pulse Rate 04/10/23 1319 64     Resp 04/10/23 1319 16     Temp 04/10/23 1319 98.3 F (36.8 C)     Temp Source 04/10/23 1319 Oral     SpO2 04/10/23 1319 100 %     Weight 04/10/23 1328 248 lb 14.4 oz (112.9 kg)     Height 04/10/23 1328 5\' 5"  (1.651 m)     Head Circumference --      Peak Flow --      Pain Score 04/10/23 1328 0     Pain Loc --      Pain Edu? --      Excl. in GC? --      Physical Exam Vitals and nursing note reviewed.  Constitutional:      General: She is not in acute distress.    Appearance: She is well-developed. She is obese.  HENT:     Head: Normocephalic and atraumatic.      Mouth/Throat:     Mouth: Mucous membranes are moist.     Pharynx: Oropharynx is clear.  Eyes:     General: No scleral icterus.    Extraocular Movements: Extraocular movements intact.     Left eye: Normal extraocular motion.     Conjunctiva/sclera: Conjunctivae normal.     Pupils: Pupils are equal, round, and reactive to light. Pupils are equal.  Cardiovascular:     Rate and Rhythm: Normal rate and regular rhythm.     Heart sounds: No murmur heard. Pulmonary:     Effort: Pulmonary effort is normal. No respiratory distress.     Breath sounds: Normal breath sounds.  Abdominal:     Palpations: Abdomen is soft.     Tenderness: There is no abdominal tenderness.  Musculoskeletal:        General: No swelling.     Cervical back: Normal range of motion and neck supple. No rigidity.  Skin:    General: Skin is warm and dry.     Capillary Refill: Capillary refill takes less than 2 seconds.  Neurological:     Mental Status: She is alert.     GCS: GCS eye subscore is 4. GCS verbal subscore is 5. GCS motor subscore  is 6.     Cranial Nerves: No cranial nerve deficit or facial asymmetry.     Sensory: No sensory deficit.     Motor: No weakness.  Psychiatric:        Mood and Affect: Mood normal.       Procedure Note  Procedures  CT Head Wo Contrast  Final Result      Julianne Rice, MD Emergency Medicine, PGY-2   Curley Spice, MD 04/10/23 1612    Charlynne Pander, MD 04/10/23 [phone]

## 2023-04-10 NOTE — ED Provider Notes (Signed)
[facility]    CSN: [id number] Arrival date & time: 04/10/23  1204      History   Chief Complaint Chief Complaint  Patient presents with   sharp pain in head     HPI Monica Glover is a 32 y.o. female.   Patient presents with intermittent sharp head pain that started yesterday around 2:30 PM.  Patient reports it is present from above the eyebrow into the forehead.  It is intermittent and she rates it 8/10 on pain scale when it occurs.  She does not currently have any head pain but reports she felt the pain just minutes prior to arrival to exam room while she was checking in.  She does have some dizziness when it occurs as well but denies blurred vision, nausea, vomiting.  Denies any recent falls or head injury.  Denies history of migraines.  She has not had any medication for symptoms.  Reports that she has a family history of stroke.  Patient states that it was a sudden onset while she was at the grocery store yesterday and she had to stop suddenly given the severity of pain.     Past Medical History:  Diagnosis Date   Asthma     Patient Active Problem List   Diagnosis Date Noted   Cesarean delivery 12/11 11/26/2017   Postpartum care following cesarean delivery 11/25/2017   Arrested active phase of labor 11/24/2017    Past Surgical History:  Procedure Laterality Date   CESAREAN SECTION N/A 11/24/2017   Procedure: CESAREAN SECTION;  Surgeon: Olivia Mackie, MD;  Location: [facility] BIRTHING SUITES;  Service: Obstetrics;  Laterality: N/A;   NO PAST SURGERIES      OB History     Gravida  1   Para  1   Term  1   Preterm      AB      Living  1      SAB      IAB      Ectopic      Multiple  0   Live Births  1            Home Medications    Prior to Admission medications   Medication Sig Start Date End Date Taking? Authorizing Provider  acetaminophen (TYLENOL) 325 MG tablet Take 2 tablets (650 mg total) by mouth every 4 (four) hours  as needed (for pain scale < 4). 11/26/17   Neta Mends, CNM  albuterol (PROVENTIL HFA;VENTOLIN HFA) 108 (90 Base) MCG/ACT inhaler Inhale into the lungs every 6 (six) hours as needed for wheezing or shortness of breath.    [provider]  coconut oil OIL Apply 1 application topically as needed. 11/26/17   Neta Mends, CNM  ibuprofen (ADVIL,MOTRIN) 800 MG tablet Take 1 tablet (800 mg total) by mouth 3 (three) times daily. 11/26/17   Neta Mends, CNM  iron polysaccharides (NIFEREX) 150 MG capsule Take 1 capsule (150 mg total) by mouth daily. 11/26/17   Neta Mends, CNM  magnesium oxide (MAG-OX) 400 (241.3 Mg) MG tablet Take 1 tablet (400 mg total) by mouth daily. 11/26/17   Neta Mends, CNM  oxyCODONE-acetaminophen (PERCOCET/ROXICET) 5-325 MG tablet Take 1 tablet by mouth every 4 (four) hours as needed (pain scale 4-7). 11/26/17   Neta Mends, CNM  Prenatal Vit-Fe Fumarate-FA (PRENATAL VITAMINS) 28-0.8 MG TABS Take 1 tablet by mouth daily. 03/29/17   Vanetta Mulders, MD  senna-docusate (SENOKOT-S) 8.6-50 MG tablet  Take 2 tablets by mouth daily. 11/27/17   Neta Mends, CNM  simethicone (MYLICON) 80 MG chewable tablet Chew 1 tablet (80 mg total) by mouth 3 (three) times daily after meals. 11/26/17   Neta Mends, CNM    Family History History reviewed. No pertinent family history.  Social History Social History   Tobacco Use   Smoking status: Never   Smokeless tobacco: Never  Substance Use Topics   Alcohol use: No   Drug use: No     Allergies   Patient has no known allergies.   Review of Systems Review of Systems Per HPI  Physical Exam Triage Vital Signs ED Triage Vitals  Enc Vitals Group     BP 04/10/23 1225 116/71     Pulse Rate 04/10/23 1221 60     Resp 04/10/23 1221 19     Temp 04/10/23 1221 97.9 F (36.6 C)     Temp src --      SpO2 04/10/23 1221 99 %     Weight --      Height --      Head Circumference --      Peak Flow --       Pain Score 04/10/23 1220 8     Pain Loc --      Pain Edu? --      Excl. in GC? --    No data found.  Updated Vital Signs BP 116/71   Pulse 60   Temp 97.9 F (36.6 C)   Resp 19   LMP 03/05/2023 (Approximate)   SpO2 99%   Visual Acuity Right Eye Distance:   Left Eye Distance:   Bilateral Distance:    Right Eye Near:   Left Eye Near:    Bilateral Near:     Physical Exam Constitutional:      General: She is not in acute distress.    Appearance: Normal appearance. She is not toxic-appearing or diaphoretic.  HENT:     Head: Normocephalic and atraumatic.   Eyes:     Extraocular Movements: Extraocular movements intact.     Conjunctiva/sclera: Conjunctivae normal.  Pulmonary:     Effort: Pulmonary effort is normal.  Neurological:     General: No focal deficit present.     Mental Status: She is alert and oriented to person, place, and time. Mental status is at baseline.     Cranial Nerves: Cranial nerves 2-12 are intact.     Sensory: Sensation is intact.     Motor: Motor function is intact.     Coordination: Coordination is intact.     Gait: Gait is intact.  Psychiatric:        Mood and Affect: Mood normal.        Behavior: Behavior normal.        Thought Content: Thought content normal.        Judgment: Judgment normal.      UC Treatments / Results  Labs (all labs ordered are listed, but only abnormal results are displayed) Labs Reviewed - No data to display  EKG   Radiology No results found.  Procedures Procedures (including critical care time)  Medications Ordered in UC Medications - No data to display  Initial Impression / Assessment and Plan / UC Course  I have reviewed the triage vital signs and the nursing notes.  Pertinent labs & imaging results that were available during my care of the patient were reviewed by me and considered in my medical decision  making (see chart for details).     Patient's neurologic exam is normal but I am  concerned for the sudden onset of severe and intermittent headache.  Patient also has no history of migraines.  Therefore, I do think that is reasonable for CT imaging of the head which cannot be performed here in urgent care.  Patient was advised to go to the ER for further evaluation and was agreeable with this plan.  Vital signs and neuroexam stable at discharge.  Agree with patient self transport to the ER. Final Clinical Impressions(s) / UC Diagnoses   Final diagnoses:  Acute nonintractable headache, unspecified headache type     Discharge Instructions      Please go to one of the provided emergency departments for further evaluation and management as you may need imaging of your head.    ED Prescriptions   None    PDMP not reviewed this encounter.   Gustavus Bryant, [place] 04/10/23 [phone]

## 2023-07-04 ENCOUNTER — Ambulatory Visit: Payer: Medicaid Other | Admitting: Diagnostic Neuroimaging

## 2023-09-30 ENCOUNTER — Ambulatory Visit: Payer: Medicaid Other | Admitting: Diagnostic Neuroimaging

## 2023-09-30 ENCOUNTER — Encounter: Payer: Self-pay | Admitting: Diagnostic Neuroimaging

## 2023-09-30 VITALS — BP 100/68 | HR 64 | Ht 65.0 in | Wt 227.0 lb

## 2023-09-30 DIAGNOSIS — G43009 Migraine without aura, not intractable, without status migrainosus: Secondary | ICD-10-CM

## 2023-09-30 DIAGNOSIS — R519 Headache, unspecified: Secondary | ICD-10-CM

## 2023-09-30 NOTE — Progress Notes (Signed)
[facility]  PATIENT: Monica Glover DOB: 03-30-1991  REFERRING CLINICIAN: Charlynne Pander, MD HISTORY FROM: patient  REASON FOR VISIT: new consult   HISTORICAL  CHIEF COMPLAINT:  Chief Complaint  Patient presents with   New Patient (Initial Visit)    Rm 7, here alone Pt is here for headaches. Pt states she had a sharp shooting pain on the frontal part of her head, that lasted a few seconds while she was driving, it has only happened twice since April/2024. Pt states there is hx of stroke in her family.     HISTORY OF PRESENT ILLNESS:   32 old female here for evaluation of headaches.  Patient has history of migraine headaches from teenage years with frontal and temporal throbbing sensation, nausea and sensitivity to light and sounds.  She may average 2-5 migraines per year.  No visual aura.  In April 2024 patient had 2 episodes of a different type of sharp sudden pain in her head lasting 5 to 10 seconds each.  She went to the emergency room for evaluation and CT of the head was unremarkable.  No specific triggering or aggravating factors.  No recurrence of symptoms.   REVIEW OF SYSTEMS: Full 14 system review of systems performed and negative with exception of: as per HPI.  ALLERGIES: No Known Allergies  HOME MEDICATIONS: Outpatient Medications Prior to Visit  Medication Sig Dispense Refill   Multiple Vitamin (MULTIVITAMIN) capsule Take 1 capsule by mouth daily.     acetaminophen (TYLENOL) 325 MG tablet Take 2 tablets (650 mg total) by mouth every 4 (four) hours as needed (for pain scale < 4).     albuterol (PROVENTIL HFA;VENTOLIN HFA) 108 (90 Base) MCG/ACT inhaler Inhale into the lungs every 6 (six) hours as needed for wheezing or shortness of breath.     coconut oil OIL Apply 1 application topically as needed.  0   ibuprofen (ADVIL,MOTRIN) 800 MG tablet Take 1 tablet (800 mg total) by mouth 3 (three) times daily. 30 tablet 0   iron  polysaccharides (NIFEREX) 150 MG capsule Take 1 capsule (150 mg total) by mouth daily.     magnesium oxide (MAG-OX) 400 (241.3 Mg) MG tablet Take 1 tablet (400 mg total) by mouth daily.     oxyCODONE-acetaminophen (PERCOCET/ROXICET) 5-325 MG tablet Take 1 tablet by mouth every 4 (four) hours as needed (pain scale 4-7). 30 tablet 0   Prenatal Vit-Fe Fumarate-FA (PRENATAL VITAMINS) 28-0.8 MG TABS Take 1 tablet by mouth daily. 30 tablet 3   senna-docusate (SENOKOT-S) 8.6-50 MG tablet Take 2 tablets by mouth daily.     simethicone (MYLICON) 80 MG chewable tablet Chew 1 tablet (80 mg total) by mouth 3 (three) times daily after meals. 30 tablet 0   SUMAtriptan (IMITREX) 100 MG tablet Take 1 tablet (100 mg total) by mouth every 2 (two) hours as needed for migraine. May repeat in 2 hours if headache persists or recurs. 10 tablet 0   No facility-administered medications prior to visit.    PAST MEDICAL HISTORY: Past Medical History:  Diagnosis Date   Asthma     PAST SURGICAL HISTORY: Past Surgical History:  Procedure Laterality Date   CESAREAN SECTION N/A 11/24/2017   Procedure: CESAREAN SECTION;  Surgeon: Olivia Mackie, MD;  Location: [facility] BIRTHING SUITES;  Service: Obstetrics;  Laterality: N/A;    FAMILY HISTORY: Family History  Problem Relation Age of Onset   Stroke Mother    Stroke Paternal Grandfather     SOCIAL  HISTORY: Social History   Socioeconomic History   Marital status: Single    Spouse name: Not on file   Number of children: Not on file   Years of education: Not on file   Highest education level: Not on file  Occupational History   Occupation: cosmeotologist  Tobacco Use   Smoking status: Never   Smokeless tobacco: Never  Vaping Use   Vaping status: Never Used  Substance and Sexual Activity   Alcohol use: No   Drug use: No   Sexual activity: Yes    Birth control/protection: None  Other Topics Concern   Not on file  Social History Narrative   Not on file    Social Determinants of Health   Financial Resource Strain: Not on file  Food Insecurity: Not on file  Transportation Needs: Not on file  Physical Activity: Not on file  Stress: Not on file  Social Connections: Not on file  Intimate Partner Violence: Not on file     PHYSICAL EXAM  GENERAL EXAM/CONSTITUTIONAL: Vitals:  Vitals:   09/30/23 1123  BP: 100/68  Pulse: 64  Weight: 227 lb (103 kg)  Height: 5\' 5"  (1.651 m)   Body mass index is 37.77 kg/m. Wt Readings from Last 3 Encounters:  09/30/23 227 lb (103 kg)  04/10/23 248 lb 14.4 oz (112.9 kg)  11/24/17 249 lb (112.9 kg)   Patient is in no distress; well developed, nourished and groomed; neck is supple  CARDIOVASCULAR: Examination of carotid arteries is normal; no carotid bruits Regular rate and rhythm, no murmurs Examination of peripheral vascular system by observation and palpation is normal  EYES: Ophthalmoscopic exam of optic discs and posterior segments is normal; no papilledema or hemorrhages No results found.  MUSCULOSKELETAL: Gait, strength, tone, movements noted in Neurologic exam below  NEUROLOGIC: MENTAL STATUS:      No data to display         awake, alert, oriented to person, place and time recent and remote memory intact normal attention and concentration language fluent, comprehension intact, naming intact fund of knowledge appropriate  CRANIAL NERVE:  2nd - no papilledema on fundoscopic exam 2nd, 3rd, 4th, 6th - pupils equal and reactive to light, visual fields full to confrontation, extraocular muscles intact, no nystagmus 5th - facial sensation symmetric 7th - facial strength symmetric 8th - hearing intact 9th - palate elevates symmetrically, uvula midline 11th - shoulder shrug symmetric 12th - tongue protrusion midline  MOTOR:  normal bulk and tone, full strength in the BUE, BLE  SENSORY:  normal and symmetric to light touch, temperature, vibration  COORDINATION:   finger-nose-finger, fine finger movements normal  REFLEXES:  deep tendon reflexes present and symmetric  GAIT/STATION:  narrow based gait     DIAGNOSTIC DATA (LABS, IMAGING, TESTING) - I reviewed patient records, labs, notes, testing and imaging myself where available.  Lab Results  Component Value Date   WBC 4.9 04/10/2023   HGB 11.5 (L) 04/10/2023   HCT 36.4 04/10/2023   MCV 90.1 04/10/2023   PLT 217 04/10/2023      Component Value Date/Time   NA 134 (L) 04/10/2023 1326   K 4.1 04/10/2023 1326   CL 104 04/10/2023 1326   CO2 23 04/10/2023 1326   GLUCOSE 93 04/10/2023 1326   BUN <5 (L) 04/10/2023 1326   CREATININE 0.84 04/10/2023 1326   CALCIUM 8.5 (L) 04/10/2023 1326   GFRNONAA >60 04/10/2023 1326   No results found for: "CHOL", "HDL", "LDLCALC", "LDLDIRECT", "TRIG", "CHOLHDL"  No results found for: "HGBA1C" No results found for: "VITAMINB12" No results found for: "TSH"  04/10/23 CT head [I reviewed images myself and agree with interpretation. -VRP]  - No acute intracranial abnormalities.     ASSESSMENT AND PLAN  32 y.o. year old female here with:   Dx:  1. Sharp headache   2. Migraine without aura and without status migrainosus, not intractable     PLAN:  HEADACHES -Symptoms resolved; monitor for now -May consider migraine treatments in future if they return  Return for return to PCP, pending if symptoms worsen or fail to improve.    Suanne Marker, MD 09/30/2023, 1:28 PM Certified in Neurology, Neurophysiology and Neuroimaging  [facility] [address], [address] [place], [place] [address] [phone]
